# Patient Record
Sex: Female | Born: 1960 | Race: White | Hispanic: No | Smoking: Former smoker
Health system: Southern US, Community
[De-identification: ages and names within clinical notes are randomized; demographics above are authoritative.]

## PROBLEM LIST (undated history)

## (undated) ENCOUNTER — Inpatient Hospital Stay: Payer: Self-pay | Admitting: Internal Medicine

## (undated) DIAGNOSIS — I10 Essential (primary) hypertension: Principal | ICD-10-CM

## (undated) DIAGNOSIS — E282 Polycystic ovarian syndrome: Secondary | ICD-10-CM

## (undated) DIAGNOSIS — E119 Type 2 diabetes mellitus without complications: Secondary | ICD-10-CM

## (undated) DIAGNOSIS — L719 Rosacea, unspecified: Secondary | ICD-10-CM

## (undated) DIAGNOSIS — E785 Hyperlipidemia, unspecified: Secondary | ICD-10-CM

## (undated) DIAGNOSIS — D219 Benign neoplasm of connective and other soft tissue, unspecified: Secondary | ICD-10-CM

## (undated) DIAGNOSIS — E669 Obesity, unspecified: Secondary | ICD-10-CM

## (undated) DIAGNOSIS — G4700 Insomnia, unspecified: Secondary | ICD-10-CM

## (undated) DIAGNOSIS — J309 Allergic rhinitis, unspecified: Secondary | ICD-10-CM

## (undated) HISTORY — PX: REFRACTIVE SURGERY: SHX103

## (undated) HISTORY — DX: Polycystic ovarian syndrome: E28.2

## (undated) HISTORY — PX: PELVIC LAPAROSCOPY: SHX162

## (undated) HISTORY — PX: REDUCTION MAMMAPLASTY: SUR839

## (undated) HISTORY — PX: TUBAL LIGATION: SHX77

## (undated) HISTORY — DX: Allergic rhinitis, unspecified: J30.9

## (undated) HISTORY — DX: Insomnia, unspecified: G47.00

## (undated) HISTORY — DX: Hyperlipidemia, unspecified: E78.5

## (undated) HISTORY — DX: Rosacea, unspecified: L71.9

## (undated) HISTORY — DX: Benign neoplasm of connective and other soft tissue, unspecified: D21.9

## (undated) HISTORY — PX: OTHER SURGICAL HISTORY: SHX169

## (undated) HISTORY — PX: ABDOMINAL HYSTERECTOMY: SHX81

## (undated) HISTORY — DX: Type 2 diabetes mellitus without complications: E11.9

## (undated) HISTORY — DX: Essential (primary) hypertension: I10

## (undated) HISTORY — DX: Obesity, unspecified: E66.9

## (undated) HISTORY — PX: CHOLECYSTECTOMY: SHX55

---

## 1999-05-11 ENCOUNTER — Other Ambulatory Visit: Admission: RE | Admit: 1999-05-11 | Discharge: 1999-05-11 | Payer: Self-pay | Admitting: Obstetrics and Gynecology

## 2000-11-12 ENCOUNTER — Other Ambulatory Visit: Admission: RE | Admit: 2000-11-12 | Discharge: 2000-11-12 | Payer: Self-pay | Admitting: Obstetrics and Gynecology

## 2001-08-08 ENCOUNTER — Encounter: Admission: RE | Admit: 2001-08-08 | Discharge: 2001-11-06 | Payer: Self-pay | Admitting: Gynecology

## 2001-11-13 ENCOUNTER — Inpatient Hospital Stay (HOSPITAL_COMMUNITY): Admission: AD | Admit: 2001-11-13 | Discharge: 2001-11-16 | Payer: Self-pay | Admitting: Gynecology

## 2001-11-13 ENCOUNTER — Encounter (INDEPENDENT_AMBULATORY_CARE_PROVIDER_SITE_OTHER): Payer: Self-pay | Admitting: Specialist

## 2001-12-22 ENCOUNTER — Other Ambulatory Visit: Admission: RE | Admit: 2001-12-22 | Discharge: 2001-12-22 | Payer: Self-pay | Admitting: Gynecology

## 2003-01-29 ENCOUNTER — Other Ambulatory Visit: Admission: RE | Admit: 2003-01-29 | Discharge: 2003-01-29 | Payer: Self-pay | Admitting: Obstetrics and Gynecology

## 2003-11-02 ENCOUNTER — Encounter: Admission: RE | Admit: 2003-11-02 | Discharge: 2003-11-02 | Payer: Self-pay | Admitting: Family Medicine

## 2004-02-18 ENCOUNTER — Other Ambulatory Visit: Admission: RE | Admit: 2004-02-18 | Discharge: 2004-02-18 | Payer: Self-pay | Admitting: Obstetrics and Gynecology

## 2004-03-23 ENCOUNTER — Ambulatory Visit: Payer: Self-pay | Admitting: Internal Medicine

## 2004-04-20 ENCOUNTER — Ambulatory Visit: Payer: Self-pay | Admitting: Family Medicine

## 2004-08-21 ENCOUNTER — Ambulatory Visit: Payer: Self-pay | Admitting: Family Medicine

## 2004-11-24 ENCOUNTER — Encounter: Admission: RE | Admit: 2004-11-24 | Discharge: 2004-11-24 | Payer: Self-pay | Admitting: Obstetrics and Gynecology

## 2005-01-17 ENCOUNTER — Encounter: Admission: RE | Admit: 2005-01-17 | Discharge: 2005-01-17 | Payer: Self-pay | Admitting: Family Medicine

## 2005-01-17 ENCOUNTER — Ambulatory Visit: Payer: Self-pay | Admitting: Family Medicine

## 2005-02-09 ENCOUNTER — Encounter (INDEPENDENT_AMBULATORY_CARE_PROVIDER_SITE_OTHER): Payer: Self-pay | Admitting: Specialist

## 2005-02-09 ENCOUNTER — Inpatient Hospital Stay (HOSPITAL_COMMUNITY): Admission: RE | Admit: 2005-02-09 | Discharge: 2005-02-12 | Payer: Self-pay | Admitting: Obstetrics and Gynecology

## 2005-02-15 ENCOUNTER — Ambulatory Visit (HOSPITAL_COMMUNITY): Admission: RE | Admit: 2005-02-15 | Discharge: 2005-02-15 | Payer: Self-pay | Admitting: Obstetrics and Gynecology

## 2005-03-08 ENCOUNTER — Other Ambulatory Visit: Admission: RE | Admit: 2005-03-08 | Discharge: 2005-03-08 | Payer: Self-pay | Admitting: Obstetrics and Gynecology

## 2005-03-13 ENCOUNTER — Encounter: Admission: RE | Admit: 2005-03-13 | Discharge: 2005-03-13 | Payer: Self-pay | Admitting: Orthopaedic Surgery

## 2005-12-05 ENCOUNTER — Encounter: Admission: RE | Admit: 2005-12-05 | Discharge: 2005-12-05 | Payer: Self-pay | Admitting: Family Medicine

## 2006-03-12 ENCOUNTER — Other Ambulatory Visit: Admission: RE | Admit: 2006-03-12 | Discharge: 2006-03-12 | Payer: Self-pay | Admitting: Obstetrics and Gynecology

## 2007-01-26 DIAGNOSIS — I10 Essential (primary) hypertension: Secondary | ICD-10-CM

## 2007-01-26 HISTORY — DX: Essential (primary) hypertension: I10

## 2007-02-03 ENCOUNTER — Ambulatory Visit: Payer: Self-pay | Admitting: Family Medicine

## 2007-02-13 ENCOUNTER — Ambulatory Visit: Payer: Self-pay | Admitting: Family Medicine

## 2007-03-18 ENCOUNTER — Other Ambulatory Visit: Admission: RE | Admit: 2007-03-18 | Discharge: 2007-03-18 | Payer: Self-pay | Admitting: Obstetrics and Gynecology

## 2007-07-01 DIAGNOSIS — J029 Acute pharyngitis, unspecified: Secondary | ICD-10-CM | POA: Insufficient documentation

## 2007-07-08 ENCOUNTER — Ambulatory Visit: Payer: Self-pay | Admitting: Family Medicine

## 2007-07-08 LAB — CONVERTED CEMR LAB: Rapid Strep: NEGATIVE

## 2007-09-08 ENCOUNTER — Ambulatory Visit: Payer: Self-pay | Admitting: Family Medicine

## 2007-09-08 LAB — CONVERTED CEMR LAB
ALT: 62 units/L — ABNORMAL HIGH (ref 0–35)
AST: 40 units/L — ABNORMAL HIGH (ref 0–37)
Albumin: 4 g/dL (ref 3.5–5.2)
Alkaline Phosphatase: 42 units/L (ref 39–117)
BUN: 9 mg/dL (ref 6–23)
Basophils Absolute: 0 10*3/uL (ref 0.0–0.1)
Basophils Relative: 0.5 % (ref 0.0–1.0)
Bilirubin, Direct: 0.1 mg/dL (ref 0.0–0.3)
CO2: 28 meq/L (ref 19–32)
Calcium: 9.4 mg/dL (ref 8.4–10.5)
Chloride: 99 meq/L (ref 96–112)
Cholesterol: 140 mg/dL (ref 0–200)
Creatinine, Ser: 0.9 mg/dL (ref 0.4–1.2)
Eosinophils Absolute: 0.2 10*3/uL (ref 0.0–0.7)
Eosinophils Relative: 3 % (ref 0.0–5.0)
GFR calc Af Amer: 87 mL/min
GFR calc non Af Amer: 72 mL/min
Glucose, Bld: 99 mg/dL (ref 70–99)
Glucose, Urine, Semiquant: NEGATIVE
HCT: 44.9 % (ref 36.0–46.0)
HDL: 41.5 mg/dL (ref >39.0)
Hemoglobin: 15.7 g/dL — ABNORMAL HIGH (ref 12.0–15.0)
Hgb A1c MFr Bld: 5.8 % (ref 4.6–6.0)
Ketones, urine, test strip: NEGATIVE
LDL Cholesterol: 81 mg/dL (ref 0–99)
Lymphocytes Relative: 24.7 % (ref 12.0–46.0)
MCHC: 35 g/dL (ref 30.0–36.0)
MCV: 96.6 fL (ref 78.0–100.0)
Monocytes Absolute: 0.6 10*3/uL (ref 0.1–1.0)
Monocytes Relative: 7.5 % (ref 3.0–12.0)
Neutro Abs: 4.9 10*3/uL (ref 1.4–7.7)
Neutrophils Relative %: 64.3 % (ref 43.0–77.0)
Nitrite: NEGATIVE
Platelets: 182 10*3/uL (ref 150–400)
Potassium: 3.2 meq/L — ABNORMAL LOW (ref 3.5–5.1)
RBC: 4.65 M/uL (ref 3.87–5.11)
RDW: 12.5 % (ref 11.5–14.6)
Sodium: 139 meq/L (ref 135–145)
Specific Gravity, Urine: >=1.03
TSH: 1.49 microintl units/mL (ref 0.35–5.50)
Testosterone: 69.13 ng/dL (ref 10.00–70.0)
Total Bilirubin: 1.3 mg/dL — ABNORMAL HIGH (ref 0.3–1.2)
Total CHOL/HDL Ratio: 3.4
Total Protein: 6.8 g/dL (ref 6.0–8.3)
Triglycerides: 87 mg/dL (ref 0–149)
Urobilinogen, UA: 0.2
VLDL: 17 mg/dL (ref 0–40)
WBC Urine, dipstick: NEGATIVE
WBC: 7.6 10*3/uL (ref 4.5–10.5)
pH: 5.5

## 2007-09-15 ENCOUNTER — Ambulatory Visit: Payer: Self-pay | Admitting: Family Medicine

## 2007-09-15 DIAGNOSIS — E282 Polycystic ovarian syndrome: Secondary | ICD-10-CM | POA: Insufficient documentation

## 2007-09-15 DIAGNOSIS — J309 Allergic rhinitis, unspecified: Secondary | ICD-10-CM | POA: Insufficient documentation

## 2007-09-15 DIAGNOSIS — E785 Hyperlipidemia, unspecified: Secondary | ICD-10-CM

## 2007-09-15 DIAGNOSIS — R3129 Other microscopic hematuria: Secondary | ICD-10-CM | POA: Insufficient documentation

## 2007-09-15 HISTORY — DX: Allergic rhinitis, unspecified: J30.9

## 2007-09-15 HISTORY — DX: Hyperlipidemia, unspecified: E78.5

## 2007-09-15 HISTORY — DX: Polycystic ovarian syndrome: E28.2

## 2007-09-15 LAB — CONVERTED CEMR LAB
Bilirubin Urine: NEGATIVE
Glucose, Urine, Semiquant: NEGATIVE
Ketones, urine, test strip: NEGATIVE
Nitrite: NEGATIVE
Protein, U semiquant: 30
Specific Gravity, Urine: 1.025
Urobilinogen, UA: 0.2
WBC Urine, dipstick: NEGATIVE
pH: 5

## 2007-09-29 ENCOUNTER — Telehealth: Payer: Self-pay | Admitting: Family Medicine

## 2007-10-07 ENCOUNTER — Ambulatory Visit: Payer: Self-pay | Admitting: Family Medicine

## 2007-10-07 DIAGNOSIS — N390 Urinary tract infection, site not specified: Secondary | ICD-10-CM | POA: Insufficient documentation

## 2007-10-07 LAB — CONVERTED CEMR LAB
Bilirubin Urine: NEGATIVE
Glucose, Urine, Semiquant: NEGATIVE
Ketones, urine, test strip: NEGATIVE
Nitrite: NEGATIVE
Specific Gravity, Urine: 1.01
Urobilinogen, UA: 0.2
WBC Urine, dipstick: NEGATIVE
pH: 5

## 2007-10-16 ENCOUNTER — Ambulatory Visit: Payer: Self-pay | Admitting: Family Medicine

## 2007-10-16 DIAGNOSIS — J45909 Unspecified asthma, uncomplicated: Secondary | ICD-10-CM | POA: Insufficient documentation

## 2008-03-18 ENCOUNTER — Ambulatory Visit: Payer: Self-pay | Admitting: Obstetrics and Gynecology

## 2008-03-18 ENCOUNTER — Other Ambulatory Visit: Admission: RE | Admit: 2008-03-18 | Discharge: 2008-03-18 | Payer: Self-pay | Admitting: Obstetrics and Gynecology

## 2008-03-18 ENCOUNTER — Encounter: Payer: Self-pay | Admitting: Obstetrics and Gynecology

## 2008-04-28 ENCOUNTER — Encounter: Admission: RE | Admit: 2008-04-28 | Discharge: 2008-04-28 | Payer: Self-pay | Admitting: Obstetrics and Gynecology

## 2008-05-25 ENCOUNTER — Telehealth: Payer: Self-pay | Admitting: Family Medicine

## 2008-09-06 ENCOUNTER — Telehealth: Payer: Self-pay | Admitting: Family Medicine

## 2008-09-08 ENCOUNTER — Telehealth: Payer: Self-pay | Admitting: *Deleted

## 2008-11-08 ENCOUNTER — Ambulatory Visit: Payer: Self-pay | Admitting: Family Medicine

## 2008-11-08 LAB — CONVERTED CEMR LAB
ALT: 33 units/L (ref 0–35)
AST: 23 units/L (ref 0–37)
Albumin: 4.3 g/dL (ref 3.5–5.2)
Alkaline Phosphatase: 44 units/L (ref 39–117)
BUN: 15 mg/dL (ref 6–23)
Basophils Absolute: 0 10*3/uL (ref 0.0–0.1)
Basophils Relative: 0.2 % (ref 0.0–3.0)
Bilirubin, Direct: 0.2 mg/dL (ref 0.0–0.3)
CO2: 29 meq/L (ref 19–32)
Calcium: 9.5 mg/dL (ref 8.4–10.5)
Chloride: 104 meq/L (ref 96–112)
Cholesterol: 178 mg/dL (ref 0–200)
Creatinine, Ser: 0.8 mg/dL (ref 0.4–1.2)
Creatinine,U: 270.5 mg/dL
Direct LDL: 108.6 mg/dL
Eosinophils Absolute: 0.1 10*3/uL (ref 0.0–0.7)
Eosinophils Relative: 1.2 % (ref 0.0–5.0)
GFR calc non Af Amer: 82.2 mL/min (ref >60)
Glucose, Bld: 91 mg/dL (ref 70–99)
Glucose, Urine, Semiquant: NEGATIVE
HCT: 45.9 % (ref 36.0–46.0)
HDL: 39.9 mg/dL (ref >39.00)
Hemoglobin: 16.2 g/dL — ABNORMAL HIGH (ref 12.0–15.0)
Hgb A1c MFr Bld: 5.9 % (ref 4.6–6.5)
Lymphocytes Relative: 18.2 % (ref 12.0–46.0)
Lymphs Abs: 2.1 10*3/uL (ref 0.7–4.0)
MCHC: 35.3 g/dL (ref 30.0–36.0)
MCV: 97.3 fL (ref 78.0–100.0)
Microalb Creat Ratio: 20 mg/g (ref 0.0–30.0)
Microalb, Ur: 5.4 mg/dL — ABNORMAL HIGH (ref 0.0–1.9)
Monocytes Absolute: 0.7 10*3/uL (ref 0.1–1.0)
Monocytes Relative: 6.2 % (ref 3.0–12.0)
Neutro Abs: 8.4 10*3/uL — ABNORMAL HIGH (ref 1.4–7.7)
Neutrophils Relative %: 74.2 % (ref 43.0–77.0)
Nitrite: NEGATIVE
Platelets: 165 10*3/uL (ref 150.0–400.0)
Potassium: 3.8 meq/L (ref 3.5–5.1)
RBC: 4.72 M/uL (ref 3.87–5.11)
RDW: 12.2 % (ref 11.5–14.6)
Sodium: 144 meq/L (ref 135–145)
Specific Gravity, Urine: >=1.03
TSH: 2.07 microintl units/mL (ref 0.35–5.50)
Testosterone: 53.17 ng/dL (ref 10.00–70.00)
Total Bilirubin: 1.6 mg/dL — ABNORMAL HIGH (ref 0.3–1.2)
Total CHOL/HDL Ratio: 4
Total Protein: 7.4 g/dL (ref 6.0–8.3)
Triglycerides: 206 mg/dL — ABNORMAL HIGH (ref 0.0–149.0)
Urobilinogen, UA: 0.2
VLDL: 41.2 mg/dL — ABNORMAL HIGH (ref 0.0–40.0)
WBC Urine, dipstick: NEGATIVE
WBC: 11.3 10*3/uL — ABNORMAL HIGH (ref 4.5–10.5)
pH: 5

## 2008-11-25 ENCOUNTER — Ambulatory Visit: Payer: Self-pay | Admitting: Family Medicine

## 2009-01-07 ENCOUNTER — Ambulatory Visit: Payer: Self-pay | Admitting: Internal Medicine

## 2009-01-21 ENCOUNTER — Ambulatory Visit: Payer: Self-pay | Admitting: Internal Medicine

## 2009-01-21 HISTORY — PX: COLONOSCOPY: SHX5424

## 2009-01-28 ENCOUNTER — Telehealth: Payer: Self-pay | Admitting: Family Medicine

## 2009-02-01 ENCOUNTER — Telehealth: Payer: Self-pay | Admitting: *Deleted

## 2009-09-12 ENCOUNTER — Ambulatory Visit: Payer: Self-pay | Admitting: Family Medicine

## 2009-09-13 ENCOUNTER — Encounter: Admission: RE | Admit: 2009-09-13 | Discharge: 2009-09-13 | Payer: Self-pay | Admitting: Obstetrics and Gynecology

## 2009-09-16 ENCOUNTER — Encounter: Payer: Self-pay | Admitting: Family Medicine

## 2009-10-11 ENCOUNTER — Ambulatory Visit: Payer: Self-pay | Admitting: Obstetrics and Gynecology

## 2009-10-11 ENCOUNTER — Other Ambulatory Visit: Admission: RE | Admit: 2009-10-11 | Discharge: 2009-10-11 | Payer: Self-pay | Admitting: Obstetrics and Gynecology

## 2009-11-08 ENCOUNTER — Ambulatory Visit: Payer: Self-pay | Admitting: Family Medicine

## 2009-11-08 LAB — CONVERTED CEMR LAB
ALT: 45 units/L — ABNORMAL HIGH (ref 0–35)
AST: 26 units/L (ref 0–37)
Albumin: 4.1 g/dL (ref 3.5–5.2)
Alkaline Phosphatase: 49 units/L (ref 39–117)
BUN: 12 mg/dL (ref 6–23)
Basophils Absolute: 0.1 10*3/uL (ref 0.0–0.1)
Basophils Relative: 0.9 % (ref 0.0–3.0)
Bilirubin Urine: NEGATIVE
Bilirubin, Direct: 0.1 mg/dL (ref 0.0–0.3)
CO2: 26 meq/L (ref 19–32)
Calcium: 9.5 mg/dL (ref 8.4–10.5)
Chloride: 106 meq/L (ref 96–112)
Cholesterol: 210 mg/dL — ABNORMAL HIGH (ref 0–200)
Creatinine, Ser: 0.8 mg/dL (ref 0.4–1.2)
Creatinine,U: 95.8 mg/dL
Direct LDL: 138.8 mg/dL
Eosinophils Absolute: 0.2 10*3/uL (ref 0.0–0.7)
Eosinophils Relative: 2.5 % (ref 0.0–5.0)
GFR calc non Af Amer: 77.36 mL/min (ref >60)
Glucose, Bld: 79 mg/dL (ref 70–99)
Glucose, Urine, Semiquant: NEGATIVE
HCT: 44.3 % (ref 36.0–46.0)
HDL: 42.4 mg/dL (ref >39.00)
Hemoglobin: 15.1 g/dL — ABNORMAL HIGH (ref 12.0–15.0)
Hgb A1c MFr Bld: 5.7 % (ref 4.6–6.5)
Ketones, urine, test strip: NEGATIVE
Lymphocytes Relative: 29.7 % (ref 12.0–46.0)
Lymphs Abs: 2.1 10*3/uL (ref 0.7–4.0)
MCHC: 34.1 g/dL (ref 30.0–36.0)
MCV: 99 fL (ref 78.0–100.0)
Microalb Creat Ratio: 2.4 mg/g (ref 0.0–30.0)
Microalb, Ur: 2.3 mg/dL — ABNORMAL HIGH (ref 0.0–1.9)
Monocytes Absolute: 0.5 10*3/uL (ref 0.1–1.0)
Monocytes Relative: 7.4 % (ref 3.0–12.0)
Neutro Abs: 4.1 10*3/uL (ref 1.4–7.7)
Neutrophils Relative %: 59.5 % (ref 43.0–77.0)
Nitrite: NEGATIVE
Platelets: 193 10*3/uL (ref 150.0–400.0)
Potassium: 4.7 meq/L (ref 3.5–5.1)
Protein, U semiquant: NEGATIVE
RBC: 4.48 M/uL (ref 3.87–5.11)
RDW: 13.4 % (ref 11.5–14.6)
Sodium: 142 meq/L (ref 135–145)
Specific Gravity, Urine: 1.02
TSH: 2.59 microintl units/mL (ref 0.35–5.50)
Testosterone: 49.26 ng/dL (ref 10.00–70.00)
Total Bilirubin: 1.3 mg/dL — ABNORMAL HIGH (ref 0.3–1.2)
Total CHOL/HDL Ratio: 5
Total Protein: 6.5 g/dL (ref 6.0–8.3)
Triglycerides: 269 mg/dL — ABNORMAL HIGH (ref 0.0–149.0)
Urobilinogen, UA: 0.2
VLDL: 53.8 mg/dL — ABNORMAL HIGH (ref 0.0–40.0)
WBC Urine, dipstick: NEGATIVE
WBC: 6.9 10*3/uL (ref 4.5–10.5)
pH: 5

## 2009-11-15 ENCOUNTER — Ambulatory Visit: Payer: Self-pay | Admitting: Family Medicine

## 2010-05-01 ENCOUNTER — Telehealth: Payer: Self-pay | Admitting: Family Medicine

## 2010-05-03 ENCOUNTER — Encounter: Payer: Self-pay | Admitting: Family Medicine

## 2010-05-03 ENCOUNTER — Ambulatory Visit (INDEPENDENT_AMBULATORY_CARE_PROVIDER_SITE_OTHER): Payer: Self-pay | Admitting: Family Medicine

## 2010-05-03 DIAGNOSIS — N63 Unspecified lump in unspecified breast: Secondary | ICD-10-CM

## 2010-05-03 DIAGNOSIS — N6320 Unspecified lump in the left breast, unspecified quadrant: Secondary | ICD-10-CM

## 2010-05-03 DIAGNOSIS — I10 Essential (primary) hypertension: Secondary | ICD-10-CM

## 2010-05-03 NOTE — Patient Instructions (Signed)
Change the Hyzaar, and take it in the morning.  Check her blood pressure daily and fax me the results at [phone] in 3 weeks.  Check the breast lump weekly if it stays the same will leave it alone if you feel like it's increasing in size.  There will need to get she is set up to go back and see the surgeon to have it excised in an hour and a for

## 2010-05-03 NOTE — Assessment & Plan Note (Signed)
Change your time of day to take your medication and take it every day.  In the morning.  Then, check a morning blood pressure daily and fax me in the data in 3 weeks fax number [phone].  I will then call you and we will discuss any further changes in her medication.

## 2010-05-03 NOTE — Assessment & Plan Note (Signed)
I recommend use check the lesion once weekly if you think it's increasing in size.  Call me and we will get to setup for a surgical consult for removal

## 2010-05-03 NOTE — Progress Notes (Signed)
Monica Cunningham is a 50 year old single female, who comes in today for evaluation of two problems.  She Has had  reduction mammaplasty and does a thorough breast exam.  Weekly.  This week.  She noticed a lump in her left breast at the 9 o'clock position in the scar around the areola.  She has noticed no change in the skin.  No nipple discharges..  She states she had a cyst like this before after her reduction mammoplasty .  The surgeon removed, it.  It was a small cyst.  Her blood pressure on Hyzaar hundred -- 25 is running 130 to 140 systolic, 85 to 90 diastolic.  She is compliant with her medication however, she always takes her medication at bedtime.  Advised her to take her medication in the morning.  Her physical exam shows a 3-mm by 3-mm, soft, rubbery, movable lesion at the 9 clock position of her left breast in the scar area from previous surgery.  The rest of the breast exam was normal except for scars noted from previous reduction mammoplasty surgery  BP is 120/90 right arm sitting position

## 2010-05-04 NOTE — Medication Information (Signed)
Summary: Coverage Approval for Singulair  Coverage Approval for Singulair   Imported By: Maryln Gottron 09/22/2009 13:07:30  _____________________________________________________________________  External Attachment:    Type:   Image     Comment:   External Document

## 2010-05-04 NOTE — Assessment & Plan Note (Signed)
Summary: cpx/mm   Vital Signs:  Patient profile:   50 year old female Menstrual status:  hysterectomy Height:      60.75 inches Weight:      161 pounds BMI:     30.78 Temp:     98.4 degrees F oral BP sitting:   140 / 90  (left arm) Cuff size:   regular  Vitals Entered By: Kern Reap CMA Duncan Dull) (November 15, 2009 9:39 AM) CC: cpx Is Patient Diabetic? No   CC:  cpx.  History of Present Illness: Monica Cunningham is a 50 year old divorced female with a daughter and two sons at home, who comes in today for an evaluation because of underlying allergic rhinitis, hypertension, polycystic ovarian syndrome, asymptomatic hematuria, and a history of asthma.  Her asthma and allergic rhinitis is treated with Singulair 10 mg daily, steroid nasal spray nightly.  Her polycystic ovarian syndrome is treated with med Sherron Monday 500 mg long-acting tablets, two tabs in the morning, two tabs in the TM.  Blood sugar normal.  A1c5.7.  No hypoglycemia.  Her hypertension is treated with Cozaar 100 mg daily.  BP 140/90.  Her nightly advised her to take a diuretic that would help suppress hair growth.  I think this is probably triamterene.  She also takes clonazepam 1 mg daily p.r.n. for anxiety.  She gets routine eye care, dental care, BSE monthly, annual mammography, By G[facility], and new evaluation by endocrinologist.  She has a cyst on her right hand.  She would like evaluated.  Tetanus booster 2006  we also decreased to simvastatin to 20 mg tonight's weekly, because she was experiencing muscle plane with daily therapy   Allergies: 1)  ! Codeine  Past History:  Past medical, surgical, family and social histories (including risk factors) reviewed, and no changes noted (except as noted below).  Past Medical History: Reviewed history from 09/15/2007 and no changes required. polycystic ovaries hypertension.  2008 bilateral breast reduction childbirth x 2 (one set twins) allergic rhinitis sleep dysfunction cough  secondary to ACE inhibitor hysterectomy  Past Surgical History: reduction mammoplasty  Family History: Reviewed history from 02/13/2007 and no changes required. Family History Hypertension  Social History: Reviewed history from 02/13/2007 and no changes required. Divorced Never Smoked Alcohol use-no Drug use-no Regular exercise-no  Review of Systems      See HPI  Physical Exam  General:  Well-developed,well-nourished,in no acute distress; alert,appropriate and cooperative throughout examination Head:  Normocephalic and atraumatic without obvious abnormalities. No apparent alopecia or balding. Eyes:  No corneal or conjunctival inflammation noted. EOMI. Perrla. Funduscopic exam benign, without hemorrhages, exudates or papilledema. Vision grossly normal. Ears:  External ear exam shows no significant lesions or deformities.  Otoscopic examination reveals clear canals, tympanic membranes are intact bilaterally without bulging, retraction, inflammation or discharge. Hearing is grossly normal bilaterally. Nose:  External nasal examination shows no deformity or inflammation. Nasal mucosa are pink and moist without lesions or exudates. Mouth:  Oral mucosa and oropharynx without lesions or exudates.  Teeth in good repair. Neck:  No deformities, masses, or tenderness noted. Chest Wall:  No deformities, masses, or tenderness noted. Breasts:  scars from reduction mammoplasty, otherwise, no palpable masses Lungs:  Normal respiratory effort, chest expands symmetrically. Lungs are clear to auscultation, no crackles or wheezes. Heart:  Normal rate and regular rhythm. S1 and S2 normal without gallop, murmur, click, rub or other extra sounds. Abdomen:  Bowel sounds positive,abdomen soft and non-tender without masses, organomegaly or hernias noted. Msk:  No  deformity or scoliosis noted of thoracic or lumbar spine.   Pulses:  R and L carotid,radial,femoral,dorsalis pedis and posterior tibial pulses  are full and equal bilaterally Extremities:  No clubbing, cyanosis, edema, or deformity noted with normal full range of motion of all joints.   Neurologic:  No cranial nerve deficits noted. Station and gait are normal. Plantar reflexes are down-going bilaterally. DTRs are symmetrical throughout. Sensory, motor and coordinative functions appear intact. Skin:  Intact without suspicious lesions or rashes Cervical Nodes:  No lymphadenopathy noted Axillary Nodes:  No palpable lymphadenopathy Inguinal Nodes:  No significant adenopathy Psych:  Cognition and judgment appear intact. Alert and cooperative with normal attention span and concentration. No apparent delusions, illusions, hallucinations   Impression & Recommendations:  Problem # 1:  EXTRINSIC ASTHMA, UNSPECIFIED (ICD-493.00) Assessment Improved  Her updated medication list for this problem includes:    Singulair 10 Mg Tabs (Montelukast sodium) .Marland Kitchen... As needed  Orders: Prescription Created Electronically [phone]) EKG w/ Interpretation (93000)  Problem # 2:  HEMATURIA, HX OF (ICD-V13.09) Assessment: Unchanged  Orders: Prescription Created Electronically [phone])  Problem # 3:  POLYCYSTIC OVARIAN DISEASE (ICD-256.4) Assessment: Improved  Problem # 4:  HYPERLIPIDEMIA (ICD-272.4) Assessment: Improved  Orders: Prescription Created Electronically [phone]) EKG w/ Interpretation (93000)  Her updated medication list for this problem includes:    Zocor 20 Mg Tabs (Simvastatin) .Marland Kitchen... 1 tab @ bedtime  Problem # 5:  ESSENTIAL HYPERTENSION, BENIGN (ICD-401.1) Assessment: Improved  The following medications were removed from the medication list:    Cozaar 100 Mg Tabs (Losartan potassium) .Marland Kitchen... Take one tab by mouth once daily Her updated medication list for this problem includes:    Cozaar 50 Mg Tabs (Losartan potassium) .Marland Kitchen... Take 1 & 1/2 qam    Hyzaar 100-25 Mg Tabs (Losartan potassium-hctz) .Marland Kitchen... Take 1 tablet by mouth every  morning  Orders: Prescription Created Electronically [phone])  Problem # 6:  HYPERLIPIDEMIA (ICD-272.4) Assessment: Improved  Orders: Prescription Created Electronically [phone]) EKG w/ Interpretation (93000)  Her updated medication list for this problem includes:    Zocor 20 Mg Tabs (Simvastatin) .Marland Kitchen... 1 tab @ bedtime  Complete Medication List: 1)  Aspirin 81 Mg  .... Otc 2)  Finacea 15 % Gel (Azelaic acid) .... As needed 3)  Plexion Cleanser 10-5 % Emul (Sulfacetamide sodium-sulfur) .... Apply two times a day 4)  Singulair 10 Mg Tabs (Montelukast sodium) .... As needed 5)  Nasonex 50 Mcg/act Susp (Mometasone furoate) .... As needed 6)  Clonazepam 1 Mg Tabs (Clonazepam) .... As needed 7)  Cozaar 50 Mg Tabs (Losartan potassium) .... Take 1 & 1/2 qam 8)  Miralax Powd (Polyethylene glycol 3350) .... As directed 9)  Metformin Hcl 500 Mg Xr24h-tab (Metformin hcl) .... Take 2 by mouth in the am and 2 tab by mouth in the pm 10)  Hyzaar 100-25 Mg Tabs (Losartan potassium-hctz) .... Take 1 tablet by mouth every morning 11)  Zocor 20 Mg Tabs (Simvastatin) .Marland Kitchen.. 1 tab @ bedtime  Anticoagulation Management Assessment/Plan:            Patient Instructions: 1)  we will add hydrochlorothiazide 25 mg daily, Cozaar.  Take one tablet daily.  If after 4 weeks y  blood pressure is not at goal.  Return for reevaluation 2)  Please schedule a follow-up appointment in 1 year. 3)  Please schedule a follow-up appointment as needed. 4)  It is important that you exercise regularly at least 20 minutes 5 times a week. If you develop  chest pain, have severe difficulty breathing, or feel very tired , stop exercising immediately and seek medical attention. 5)  Schedule your mammogram. 6)  Take an Aspirin every day. 7)  simvastatin 20 mg twice weekly Prescriptions: ZOCOR 20 MG TABS (SIMVASTATIN) 1 tab @ bedtime  #100 x 2   Entered and Authorized by:   Roderick Pee MD   Signed by:   Roderick Pee MD on  11/15/2009   Method used:   Electronically to        [facility]  [phone]* (retail)       [address]       [place], [place]  [address]       Ph: [phone] or [phone]       Fax: [phone]   RxID:   [id number] METFORMIN HCL 500 MG XR24H-TAB (METFORMIN HCL) take 2 by mouth in the am and 2 tab by mouth in the pm  #400 x 3   Entered and Authorized by:   Roderick Pee MD   Signed by:   Roderick Pee MD on 11/15/2009   Method used:   Electronically to        [facility]  [phone]* (retail)       [address]       [place], [place]  [address]       Ph: [phone] or [phone]       Fax: [phone]   RxID:   [id number] SINGULAIR 10 MG  TABS (MONTELUKAST SODIUM) as needed  #100 x 3   Entered and Authorized by:   Roderick Pee MD   Signed by:   Roderick Pee MD on 11/15/2009   Method used:   Electronically to        [facility]  [phone]* (retail)       [address]       [place], [place]  [address]       Ph: [phone] or [phone]       Fax: [phone]   RxID:   [id number] HYZAAR 100-25 MG TABS (LOSARTAN POTASSIUM-HCTZ) Take 1 tablet by mouth every morning  #100 x 3   Entered and Authorized by:   Roderick Pee MD   Signed by:   Roderick Pee MD on 11/15/2009   Method used:   Electronically to        [facility]  [phone]* (retail)       [address]       [place], [place]  [address]       Ph: [phone] or [phone]       Fax: [phone]   RxID:   [id number]

## 2010-05-04 NOTE — Assessment & Plan Note (Signed)
Summary: fup on meds//ccm   Vital Signs:  Patient profile:   50 year old female Menstrual status:  hysterectomy Weight:      157 pounds Temp:     98.7 degrees F oral BP sitting:   140 / 90  (right arm)  Vitals Entered By: Kathrynn Speed CMA (September 12, 2009 10:02 AM) CC: FU BP meds not working   CC:  FU BP meds not working.  History of Present Illness: yamil is a 50 year old single female, who comes in today because her blood pressure is not under good control on Cozaar 50 mg daily.  BP averaging 140/90.  She's been compliant with her medication.  Blood sugar is normal on metformin 1000 mg b.i.d. needs.  A1c will do that today.  She stopped her simvastatin 3 months ago because of generalized aching.  She states the aching went away.  Advised to restart the simvastatin to just take one tablet on Monday and Friday  she's also complaining of, thoracic back pain.  It's been present for a couple months.  During tax season.  She worked about 60 to 70 hours per week    Current Medications (verified): 1)  Aspirin 81 Mg .... Otc 2)  Finacea 15 %  Gel (Azelaic Acid) .... As Needed 3)  Plexion Cleanser 10-5 %  Emul (Sulfacetamide Sodium-Sulfur) .... Apply Two Times A Day 4)  Singulair 10 Mg  Tabs (Montelukast Sodium) .... As Needed 5)  Nasonex 50 Mcg/act  Susp (Mometasone Furoate) .... As Needed 6)  Clonazepam 1 Mg  Tabs (Clonazepam) .... As Needed 7)  Cozaar 50 Mg Tabs (Losartan Potassium) .... Take 1 Tablet By Mouth Every Morning 8)  Metformin Hcl 1000 Mg Tabs (Metformin Hcl) .... Take 1 Tablet By Mouth Two Times A Day 9)  Miralax   Powd (Polyethylene Glycol 3350) .... As Directed  Allergies (verified): 1)  ! Codeine  Past History:  Past medical, surgical, family and social histories (including risk factors) reviewed for relevance to current acute and chronic problems.  Past Medical History: Reviewed history from 09/15/2007 and no changes required. polycystic  ovaries hypertension.  2008 bilateral breast reduction childbirth x 2 (one set twins) allergic rhinitis sleep dysfunction cough secondary to ACE inhibitor hysterectomy  Family History: Reviewed history from 02/13/2007 and no changes required. Family History Hypertension  Social History: Reviewed history from 02/13/2007 and no changes required. Divorced Never Smoked Alcohol use-no Drug use-no Regular exercise-no  Review of Systems      See HPI  Physical Exam  General:  Well-developed,well-nourished,in no acute distress; alert,appropriate and cooperative throughout examination Msk:  No deformity or scoliosis noted of thoracic or lumbar spine.   Skin:  Intact without suspicious lesions or rashes   Impression & Recommendations:  Problem # 1:  HYPERLIPIDEMIA (ICD-272.4) Assessment Deteriorated  The following medications were removed from the medication list:    Simvastatin 20 Mg Tabs (Simvastatin) .Marland Kitchen... 1 tab @ bedtime  Problem # 2:  ESSENTIAL HYPERTENSION, BENIGN (ICD-401.1) Assessment: Deteriorated  Her updated medication list for this problem includes:    Cozaar 50 Mg Tabs (Losartan potassium) .Marland Kitchen... Take 1 & 1/2 qam  Complete Medication List: 1)  Aspirin 81 Mg  .... Otc 2)  Finacea 15 % Gel (Azelaic acid) .... As needed 3)  Plexion Cleanser 10-5 % Emul (Sulfacetamide sodium-sulfur) .... Apply two times a day 4)  Singulair 10 Mg Tabs (Montelukast sodium) .... As needed 5)  Nasonex 50 Mcg/act Susp (Mometasone furoate) .... As needed  6)  Clonazepam 1 Mg Tabs (Clonazepam) .... As needed 7)  Cozaar 50 Mg Tabs (Losartan potassium) .... Take 1 & 1/2 qam 8)  Metformin Hcl 1000 Mg Tabs (Metformin hcl) .... Take 1 tablet by mouth two times a day 9)  Miralax Powd (Polyethylene glycol 3350) .... As directed  Patient Instructions: 1)  increase the Cozaar to 75 mg daily check her BP daily in the morning.  If after 4 weeks, your blood pressure is not normal........Marland Kitchen 135/85 or  less........... then increase the Cozaar to 100 mg daily. 2)  Take 600 mg of Motrin twice a day with food and to 15 minutes of stretching for your back pain. 3)  Return the fourth week in August for your annual exam. 4)  BMP prior to visit, ICD-9:..............256.4,272.4 5)  Hepatic Panel prior to visit, ICD-9: 6)  Lipid Panel prior to visit, ICD-9: 7)  TSH prior to visit, ICD-9: 8)  CBC w/ Diff prior to visit, ICD-9: 9)  Urine-dip prior to visit, ICD-9: 10)  HbgA1C prior to visit, ICD-9: 11)  Urine Microalbumin prior to visit, ICD-9: Prescriptions: CLONAZEPAM 1 MG  TABS (CLONAZEPAM) as needed  #100 x 4   Entered and Authorized by:   Roderick Pee MD   Signed by:   Roderick Pee MD on 09/12/2009   Method used:   Print then Give to Patient   RxID:   [id number] SINGULAIR 10 MG  TABS (MONTELUKAST SODIUM) as needed  #100 x 3   Entered and Authorized by:   Roderick Pee MD   Signed by:   Roderick Pee MD on 09/12/2009   Method used:   Print then Give to Patient   RxID:   [id number] COZAAR 50 MG TABS (LOSARTAN POTASSIUM) Take 1 & 1/2 qam  #150 x 3   Entered and Authorized by:   Roderick Pee MD   Signed by:   Roderick Pee MD on 09/12/2009   Method used:   Print then Give to Patient   RxID:   [id number]

## 2010-05-10 NOTE — Progress Notes (Signed)
Summary: Pt req referral to [facility]  Phone Note Call from Patient Call back at Home Phone [phone] Call back at [phone] cell   Caller: Patient Summary of Call: Pt req referral to [facility] for mmg. Pt found small lump in lft breast.  Initial call taken by: Lucy Antigua,  May 01, 2010 10:38 AM  Follow-up for Phone Call        office visit today or tomorrow and then we will set up for diagnostic studies Follow-up by: Roderick Pee MD,  May 01, 2010 11:05 AM  Additional Follow-up for Phone Call Additional follow up Details #1::        patient is aware and appointment made Additional Follow-up by: Kern Reap CMA Duncan Dull),  May 01, 2010 3:48 PM

## 2010-07-05 LAB — HM DIABETES EYE EXAM

## 2010-07-06 LAB — GLUCOSE, CAPILLARY
Glucose-Capillary: 108 mg/dL — ABNORMAL HIGH (ref 70–99)
Glucose-Capillary: 94 mg/dL (ref 70–99)

## 2010-07-12 ENCOUNTER — Encounter: Payer: Self-pay | Admitting: Family Medicine

## 2010-07-14 ENCOUNTER — Telehealth: Payer: Self-pay | Admitting: Family Medicine

## 2010-07-14 NOTE — Telephone Encounter (Signed)
Patient would like for Fleet Contras to return her call today.

## 2010-07-14 NOTE — Telephone Encounter (Signed)
patient  Is calling because she has been taking one and half tabs of hyzaar 100/25 and has been having normal blood pressure readings.  She would like to know if this is okay and if so she will need a refill.

## 2010-07-17 ENCOUNTER — Other Ambulatory Visit: Payer: Self-pay | Admitting: *Deleted

## 2010-07-17 MED ORDER — CLONAZEPAM 1 MG PO TABS: 1.0000 mg | ORAL_TABLET | Freq: Two times a day (BID) | ORAL | Status: DC | PRN

## 2010-07-17 MED ORDER — LOSARTAN POTASSIUM-HCTZ 100-25 MG PO TABS: 1.0000 | ORAL_TABLET | Freq: Two times a day (BID) | ORAL | Status: DC

## 2010-07-17 NOTE — Telephone Encounter (Signed)
If blood pressure is indeed normal, then, she needs to continue that dose of medication

## 2010-08-18 NOTE — Op Note (Signed)
Monica Cunningham, PERSINGER                 ACCOUNT NO.:  [id number]   MEDICAL RECORD NO.:  [id number]          PATIENT TYPE:  INP   LOCATION:  X001                         FACILITY:  [facility]   PHYSICIAN:  Rande Brunt. Gottsegen, M.D.DATE OF BIRTH:  1960/04/25   DATE OF PROCEDURE:  02/09/2005  DATE OF DISCHARGE:                                 OPERATIVE REPORT   PREOPERATIVE DIAGNOSES:  1.  Dysfunctional uterine bleeding with anemia.  2.  Dysmenorrhea.  3.  Adenomyosis.  4.  Urinary stress incontinence.   POSTOPERATIVE DIAGNOSES:  1.  Dysfunctional uterine bleeding with anemia.  2.  Dysmenorrhea.  3.  Adenomyosis.  4.  Urinary stress incontinence.   OPERATIONS:  1.  Total abdominal hysterectomy.  2.  Modified McCall's closure of cul-de-sac.  3.  Burch procedure.  4.  Closure of small cystotomy incision.   SURGEON:  Dr. Eda Paschal   FIRST ASSISTANT:  Dr. Farrel Gobble   FINDINGS:  The patient had a boggy, slightly enlarged uterus.  She had some  clear, raised lesions on both ovaries that raised the suspicion that they  were superficial endometrial implants.  They were not pigmented or white.  She had 1 black pigmentation of endometriosis in the cul-de-sac.  Ovaries  and fallopian tubes were otherwise normal as was rest of the exploration of  the abdomen.  At the time of Burch, a small cystotomy incision was made at  the top of the fundus during the suturing of less than 0.5 cm.   PROCEDURE:  After adequate general anesthesia, the patient was placed in the  modified supine position, so the surgeon later could get his fingers into  the vagina to do the Burch, and she was then prepped and draped the usual  sterile manner.  A small transverse incision was made using the area where  she had her abdominoplasty.  It was taken down, and the fascia was opened  transversely, and the peritoneum was entered vertically.  Subcutaneous  bleeders were clamped and bovied as encountered.  When the peritoneal  cavity  was opened, the above findings were noted.  The round ligaments were bovied  and cut.  The vesicouterine fold of peritoneum and posterior peritoneum were  sharply incised.  The utero-ovarian ligaments and fallopian tubes were  clamped, cut, and doubly suture ligated with #1 chromic catgut, leaving the  ovaries and tubes in place.  The areas that could have been clear areas of  endometriosis were cauterized on both ovaries.  The bladder flap was  advanced by sharp dissection.  The uterine arteries were clamped, cut, and  doubly suture ligated with #1 chromic catgut.  The parametrium was taken  down successive bites by clamping, cutting, and suture ligating with #1  chromic catgut.  The uterosacral cardinals were isolated and sutured in  order to help with the McCall's with #1 chromic catgut.  The cervical  vaginal junction was identified and entered, and the uterus was sent to  pathology for tissue diagnosis.  Angled sutures were placed in the angles of  the vagina with #1 chromic catgut.  Number 1 chromic was used in figure-of-  eights to close the cuff.  Copious irrigation was done, and hemostasis was  complete.  At this point, a modified McCall suture was utilized using a 3-0  Vicryl, incorporating uterosacrals back of the vault and the sigmoid colon,  and this completely obliterated the cul-de-sac.  Prior to doing that, an  area of endometriosis in the cul-de-sac was identified and cauterized.  Two  sponge, needle, instrument counts were correct.  The peritoneum was closed  with a running 0 Vicryl.  Attention was next turned to the Burch.  The space  of Retzius could be easily entered and dissected free .Using 0 ethibond  so  that it would be a nonabsorbable suture two sutures were placed on either  side.  They were figure-of-eight sutures, incorporating vaginal tissue at  the urethrovesical angle, one more inferior than the other.  The first side  went well.  When the second  side was done, the bladder had stopped draining,  and a small nick was made in the bladder with the suture.  A lot of urine  that was in the bladder drained out.  The defect was closed with two 2-0  Vicryl sutures, burying the first layer with the second, and this created a  watertight closure and then the other sutures could be placed on the right  side periurethrally.  The bladder was then filled with a solution of sterile  saline with indigo carmine, and there was absolutely no leakage.  The  solution was then drained, and both sutures on either side could be nicely  sutured into her Cooper's ligament which were excellent, and a great  elevation of the urethrovesical angle was obtained.  Gelfoam was placed in  the space of Retzius for additional scarring effect as well as to control  bleeding.  A Blake drain was placed to drain the space of Retzius.  The  fascia was then closed with two running 0 Vicryl.  Subcutaneous tissue was  brought together to take some tension off the staples since the patient had  very little subcutaneous tissue and did not have much give after her  abdominoplasty, and then the skin was closed with staples.  The subcutaneous  suture was 2-0 plain.  Estimated blood loss for the entire procedure was 400  mL.  At the termination of procedure, the Foley was changed to a #18 with a  smaller balloon, and this was left to gravity drainage.  The patient left  the operating room in satisfactory condition.      Daniel L. Eda Paschal, M.D.  Electronically Signed     DLG/MEDQ  D:  02/09/2005  T:  02/09/2005  Job:  [id number]

## 2010-08-18 NOTE — Discharge Summary (Signed)
Monica Cunningham, ANGE                 ACCOUNT NO.:  [id number]   MEDICAL RECORD NO.:  [id number]          PATIENT TYPE:  INP   LOCATION:  1604                         FACILITY:  [facility]   PHYSICIAN:  Rande Brunt. Gottsegen, M.D.DATE OF BIRTH:  05-20-1960   DATE OF ADMISSION:  02/09/2005  DATE OF DISCHARGE:  02/12/2005                                 DISCHARGE SUMMARY   The patient is a 50 year old female who was admitted to the hospital with  dysmenorrhea, menorrhagia, endometriosis and urinary stress incontinence. On  the day at the hospital, she was taken to the operating room, a total  abdominal hysterectomy, fulguration of endometriosis and a Burch procedure  was done. During the dissection of the space of Retzius and placing of the  sutures, a small defect at the top of the fundus was made in the bladder. It  was closed in two layers without any difficulty. However, it was felt that  postoperatively we should continue her Foley and then do a postoperative  cystogram especially with her history of diabetes. The only other issue with  her postoperatively was that she started at 10 g of Hemoglobin, dropped to  7.7 and then eventually leveled off at 6.9. She could tolerated this well.  She was ready for discharge on the third postoperative day.   DISCHARGE MEDICATIONS:  Percocet for pain relief as well as ibuprofen. She  was also placed on Chromagen 1 daily for iron replacement.   We did a culture and sensitivity which will be ready when she comes back on  Thursday after her cystogram for removal of her Foley. The final pathology  report is not available at the time of dictation.   CONDITION ON DISCHARGE:  Improved.   DISCHARGE DIAGNOSES:  1.  Dysfunctional uterine bleeding with anemia.  2.  Dysmenorrhea.  3.  Endometriosis.  4.  Urinary stress incontinence.   OPERATION:  Total abdominal hysterectomy, fulguration of endometriosis,  McCall's elimination of the cul-de-sac to prevent  enterocele. Burch  procedure, repair of small cystotomy.      Daniel L. Eda Paschal, M.D.  Electronically Signed     DLG/MEDQ  D:  02/12/2005  T:  02/12/2005  Job:  [id number]

## 2010-08-18 NOTE — H&P (Signed)
   NAME:  Monica Cunningham, Monica Cunningham                           ACCOUNT NO.:  [id number]   MEDICAL RECORD NO.:  [id number]                   PATIENT TYPE:  INP   LOCATION:  NA                                   FACILITY:  [facility]   PHYSICIAN:  Timothy P. Fontaine, M.D.           DATE OF BIRTH:  31-Aug-1960   DATE OF ADMISSION:  11/13/2001  DATE OF DISCHARGE:                                HISTORY & PHYSICAL   CHIEF COMPLAINT:  1. Pregnancy at term.  2. History of prior cesarean section, desires repeat cesarean section.  3. Desires permanent sterilization.  4. History of positive beta Strep.  5. Gestational diabetes, insulin controlled with normal antepartum testing     and good glucose control.   HISTORY OF PRESENT ILLNESS:  The patient is a 50 year old, G4, P2, female at  term gestation.  History of prior cesarean section who desires repeat  cesarean section after counseling for trial of labor.  The patient also  desires permanent sterilization.  The patient is being followed for  gestational diabetes.  She is on insulin, and is in good control.  She has  normal antepartum testing.  The remainder of her history, see her hollister.   PHYSICAL EXAMINATION:  HEENT:  Normal.  LUNGS:  Clear.  CARDIAC:  Regular rate without murmurs, rubs, or gallops.  ABDOMEN:  Gravid fundus, consistent with term, positive fetal heart tones.  PELVIC:  Deferred.   ASSESSMENT:  A 64 -year-old with a history of prior cesarean section,  desires repeat cesarean section and tubal sterilization.  The risks,  benefits, alternatives, and indications for the procedure were reviewed with  the patient to include the risks of bleeding, transfusion, infection,  prolonged antibiotics, abscess drainage, wound complications, requiring  opening and draining of incisions, closure by secondary intention,  inadvertent injury to internal organs, including bladder, bowel, ureters,  vessels, and nerves, necessitating major exploratory repair  and surgery, and  _________ surgeries including ostomy formation.  The risks of fetal injury,  including musculoskeletal, neural, and scalpal were all reviewed,  understood, and accepted.  The permanency of the tubal sterilization, as  well as the potential for failure was reviewed with her, and she understands  and accepts this.  The patient's questions were answered to her  satisfaction.  She is ready to proceed with surgery.                                               Timothy P. Audie Box, M.D.    TPF/MEDQ  D:  11/07/2001  T:  11/08/2001  Job:  [id number]

## 2010-08-18 NOTE — Discharge Summary (Signed)
   NAME:  Monica Cunningham, Monica Cunningham NO.:  [id number]   MEDICAL RECORD NO.:  [id number]                   PATIENT TYPE:   LOCATION:                                       FACILITY:   PHYSICIAN:  Ivor Costa. Farrel Gobble, M.D.              DATE OF BIRTH:   DATE OF ADMISSION:  11/13/2001  DATE OF DISCHARGE:  11/16/2001                                 DISCHARGE SUMMARY   DISCHARGE DIAGNOSES:  1. Intrauterine pregnancy at 38+ weeks, delivery.  2. History of prior cesarean section, desires repeat cesarean section and     attempt at permanent sterilization.  3. History of positive Group B Strep.  4. Gestational diabetes, insulin controlled.  5. Status post repeat low transverse cesarean section.  6. Bilateral tubal sterilization, modified Pomeroy technique by Dr. Colin Broach on 11/13/01.   HISTORY OF PRESENT ILLNESS:  The patient is a 50 year old female, gravida 4,  para 2, with an EDC of 11/25/01.  Prenatal course was uncomplicated.  Because  of advanced maternal age, patient had amniocentesis which showed normal  __________.  History of prior cesarean section.  Desired repeat cesarean  section and desired attempt at permanent sterilization.  History of positive  Group B Strep, gestational diabetes, insulin controlled during pregnancy.   HOSPITAL COURSE:  On 11/13/01, the patient was admitted, underwent a repeat  low transverse cesarean section, bilateral tubal sterilization, modified  Pomeroy technique by Dr. Colin Broach and at 1255 underwent delivery of  a female infant with Apgar's of 9 and 9, weight of 8 pounds and 1 ounce.  There were no complications.  Postpartum, the patient remained afebrile,  voiding, in stable condition.  She was discharged home on 11/16/01, and given  [facility] instructions.   LABORATORY DATA:  The patient is AB+, rubella immune.  Hemoglobin on 11/14/01  was 10.8.   DISPOSITION:  The patient is discharged home,  is asked to return to the  office in six weeks.   DISCHARGE MEDICATIONS:  Percocet p.r.n. pain #30.     Susa Loffler, P.A.                    Ivor Costa. Farrel Gobble, M.D.    TSG/MEDQ  D:  12/26/2001  T:  12/26/2001  Job:  [id number]

## 2010-08-18 NOTE — H&P (Signed)
NAMEJAMONICA, Monica Cunningham                 ACCOUNT NO.:  [id number]   MEDICAL RECORD NO.:  [id number]          PATIENT TYPE:  INP   LOCATION:  NA                           FACILITY:  [facility]   PHYSICIAN:  Daniel L. Gottsegen, M.D.DATE OF BIRTH:  10/16/1960   DATE OF ADMISSION:  DATE OF DISCHARGE:                                HISTORY & PHYSICAL   Patient is for the operating room tomorrow morning, February 09, 2005 at  [facility] at 7:30 A.M.   CHIEF COMPLAINT:  Dysmenorrhea, menorrhagia, probable adenomyosis, urinary  stress incontinence.   HISTORY OF PRESENT ILLNESS:  The patient is a 50 year old gravida 4, para 2,  AB 3 who has presented to the office with persistent menometrorrhagia  associated with anemia requiring iron treatment, severe dysmenorrhea,  history of endometriosis previously diagnosed at the time of laparoscopy in  1997, and urinary stress incontinence.  She has tried oral contraceptives in  the past for some of the above problems without relief.  Saline infusion  hysterogram was done on her in September 2006 which revealed adenomyosis, no  ovarian pathology, no intracavity defects.  As a result of this persistence,  she now enters the hospital for total abdominal hysterectomy for treatment  of the above.  We plan to preserve her ovaries if they are normal.  She has  given me permission to remove one ovary for significant disease, especially  endometriosis but we will not remove both ovaries unless she has an  undiagnosed malignancy.  In addition, she is having trouble with losing  urine with coughing, laughing and sneezing.  She was seen by Dr. Alfredo Martinez of the urology department who did urodynamics and he felt that  for [place] to proceed with a Burch procedure at the same time would be  appropriate.   PAST MEDICAL HISTORY:  Previous laparoscopy in 1997 showing endometriosis,  Cesarean section, cholecystectomy, breast reduction with tummy tuck in April  2004, tubal ligation.   CURRENT MEDICATIONS:  Nonsteroidal anti-inflammatory drugs.   ALLERGIES:  CODEINE makes her nauseated.   FAMILY HISTORY:  Mother and aunt are diabetic.  Sister and mother are  hypertensive.  Maternal grandmother had uterine cancer.  Maternal  grandparents had coronary artery disease.   SOCIAL HISTORY:  She drinks alcohol occasionally.  She is a nonsmoker.   REVIEW OF SYSTEMS:  GENERAL:  Negative.  SKIN AND BREASTS:  Negative.  EYES,  EARS, NOSE, MOUTH AND THROAT:  Negative.  CARDIOVASCULAR:  Negative.  RESPIRATORY:  Negative.  GASTROINTESTINAL:  Negative.  MUSCULOSKELETAL:  Negative.  GENITOURINARY:  As above.  NEUROLOGICAL/PSYCHOLOGICAL:  Negative.  ALLERGIC/IMMUNOLOGIC/LYMPHATIC AND ENDOCRINE:  Negative except for nausea  with CODEINE.   PHYSICAL EXAMINATION:  GENERAL APPEARANCE: The patient is a well-developed,  well-nourished female in no acute distress.  VITAL SIGNS:  Blood pressure 138/86, pulse 80 and regular, respirations 16,  non labored.  She is afebrile.  HEENT:  All within normal limits.  NECK:  Supple.  Trachea is in the midline.  Thyroid is not enlarged.  LUNGS:  Clear to auscultation and percussion.  HEART:  No thrills, heaves, murmurs.  BREASTS:  No masses.  ABDOMEN:  Soft without guarding, rebound or masses.  PELVIC:  External and vaginal normal.  Cervix is clean.  Pap smear shows no  atypia.  Uterus is top normal size and shape.  Adnexa are not palpably  enlarged.  Rectovaginal negative.  EXTREMITIES:  Within normal limits.   ADMISSION IMPRESSION:  1.  Severe dysmenorrhea with menorrhagia, adenomyosis suspected.  2.  Urinary stress incontinence.   PLAN:  As noted above.      Daniel L. Eda Paschal, M.D.  Electronically Signed     DLG/MEDQ  D:  02/08/2005  T:  02/08/2005  Job:  [id number]

## 2010-08-18 NOTE — Op Note (Signed)
NAME:  Monica Cunningham, Monica Cunningham                           ACCOUNT NO.:  [id number]   MEDICAL RECORD NO.:  [id number]                   PATIENT TYPE:  INP   LOCATION:  9124                                 FACILITY:  [facility]   PHYSICIAN:  Timothy P. Fontaine, M.D.           DATE OF BIRTH:  04/02/1961   DATE OF PROCEDURE:  11/13/2001  DATE OF DISCHARGE:                                 OPERATIVE REPORT   PREOPERATIVE DIAGNOSES:  1. Pregnancy at term.  2. History of prior cesarean section.  3. Desires repeat cesarean section.  4. Desires permanent sterilization.  5. History of positive beta strep.  6. Gestational diabetes, insulin controlled with normal antepartum testing,     good glucose control.   POSTOPERATIVE DIAGNOSES:  1. Pregnancy at term.  2. History of prior cesarean section.  3. Desires repeat cesarean section.  4. Desires permanent sterilization.  5. History of positive beta strep.  6. Gestational diabetes, insulin controlled with normal antepartum testing,     good glucose control.   PROCEDURE:  1. Repeat low transverse cervical cesarean section.  2. Bilateral tubal sterilization, modified Pomeroy technique.   SURGEON:  Timothy P. Fontaine, M.D.   ASSISTANTBenjamine Mola M. Ciliberti, M.D.   ANESTHESIA:  Spinal.   ESTIMATED BLOOD LOSS:  Less than 500 cc.   COMPLICATIONS:  None.   SPECIMENS:  Portions of right and portions of left fallopian tube, placenta,  cord blood.   FINDINGS:  At 12:55 normal female infant, Apgars 9 and 9, weight 8 pounds 1  ounce.  Pelvic anatomy noted to be normal.  Apgars 9 and 9.  Weight 8 pounds  1 ounce.  Pelvic anatomy noted to be normal.   DESCRIPTION OF PROCEDURE:  The patient was taken to the operating room and  underwent spinal anesthesia.  She as placed in the left tilt supine position  where she received a Betadine preparation with Betadine solution.  The  bladder was emptied with an indwelling Foley catheter placed in sterile  technique.   The patient was draped in the usual fashion.  After assuring  adequate anesthesia, the abdomen was sharply entered through a repeat  Pfannenstiel incision achieving adequate hemostasis at all levels.  The  bladder flap was sharply and bluntly developed without difficulty.  The  uterus was sharply entered in the lower uterine segment and  bluntly  extended laterally.  The bulging membranes were ruptured, the fluid noted to  be clear.  The infant's head  was delivered through the incision.  The nares  and mouth were suctioned.  The rest of the infant was delivered.  The cord  was doubly clamped and cut, and the infant handed to pediatrics in  attendance.  Samples of cord blood were obtained.  The placenta was  spontaneously extruded, noted to be intact, and was sent to pathology.  The  patient received 1 g Ancef antibiotic prophylaxis.  The uterus was  exteriorized and the endometrial cavity explored with a sponge to remove all  placental and membrane fragments.  The uterine incision was then closed in  one layer using 0 Vicryl suture in a running interlocking stitch.  Several  interrupted figure-of-eight sutures were then placed to achieve ultimate  hemostasis.  The right fallopian tube was identified, traced from its  insertion to its fimbriated end and a mid tubal segment was doubly ligated  using 0 plain suture, and the intervening segment was excised.  The tubal  lumen as well as adequate hemostasis was visualized grossly.  A similar  procedure was carried out on the other side.  The uterus was then returned  to the abdomen.  Both tubal pedicles were inspected showing adequate  hemostasis , the pelvis irrigated.  Adequate hemostasis visualized, and the  anterior fascia was then reapproximated using 0 Vicryl suture in a running  stitch, starting at the angle and meeting in the middle.  Subcutaneous  tissues were irrigated and adequate hemostasis achieved with electrocautery,  and the skin  was then reapproximated using staples with intervening Steri-  Strips and Benzoin application.  The sterile dressing was applied.  The  patient was taken to the recovery room in good condition having tolerated  the procedure well.                                               Timothy P. Audie Box, M.D.    TPF/MEDQ  D:  11/13/2001  T:  11/14/2001  Job:  [id number]

## 2010-10-11 ENCOUNTER — Telehealth: Payer: Self-pay | Admitting: Family Medicine

## 2010-10-11 DIAGNOSIS — N6320 Unspecified lump in the left breast, unspecified quadrant: Secondary | ICD-10-CM

## 2010-10-11 NOTE — Telephone Encounter (Signed)
Pt has knot in her breast that Dr Tawanna Cooler knows about. Pt would like to have a referral for another mammogram to [facility].

## 2010-10-12 ENCOUNTER — Other Ambulatory Visit: Payer: Self-pay | Admitting: Family Medicine

## 2010-10-12 DIAGNOSIS — N6320 Unspecified lump in the left breast, unspecified quadrant: Secondary | ICD-10-CM

## 2010-10-12 NOTE — Telephone Encounter (Signed)
Order sent and patient aware

## 2010-10-12 NOTE — Telephone Encounter (Signed)
Ok

## 2010-11-21 ENCOUNTER — Other Ambulatory Visit (INDEPENDENT_AMBULATORY_CARE_PROVIDER_SITE_OTHER): Payer: BC Managed Care – PPO

## 2010-11-21 DIAGNOSIS — Z0000 Encounter for general adult medical examination without abnormal findings: Secondary | ICD-10-CM

## 2010-11-21 LAB — CBC WITH DIFFERENTIAL/PLATELET
Basophils Relative: 0.8 % (ref 0.0–3.0)
Eosinophils Absolute: 0.2 10*3/uL (ref 0.0–0.7)
Eosinophils Relative: 2.2 % (ref 0.0–5.0)
Lymphocytes Relative: 26 % (ref 12.0–46.0)
MCHC: 33.7 g/dL (ref 30.0–36.0)
Neutrophils Relative %: 62.4 % (ref 43.0–77.0)
RBC: 4.58 Mil/uL (ref 3.87–5.11)
WBC: 9 10*3/uL (ref 4.5–10.5)

## 2010-11-21 LAB — LIPID PANEL
HDL: 46.4 mg/dL (ref >39.00)
LDL Cholesterol: 109 mg/dL — ABNORMAL HIGH (ref 0–99)
Total CHOL/HDL Ratio: 4
Triglycerides: 194 mg/dL — ABNORMAL HIGH (ref 0.0–149.0)

## 2010-11-21 LAB — HEPATIC FUNCTION PANEL
Alkaline Phosphatase: 44 U/L (ref 39–117)
Bilirubin, Direct: 0.2 mg/dL (ref 0.0–0.3)
Total Protein: 7.3 g/dL (ref 6.0–8.3)

## 2010-11-21 LAB — POCT URINALYSIS DIPSTICK
Glucose, UA: NEGATIVE
Nitrite, UA: NEGATIVE
Protein, UA: NEGATIVE
Urobilinogen, UA: 0.2
pH, UA: 5

## 2010-11-21 LAB — BASIC METABOLIC PANEL
Calcium: 9.7 mg/dL (ref 8.4–10.5)
Creatinine, Ser: 1 mg/dL (ref 0.4–1.2)
GFR: 62.98 mL/min (ref >60.00)

## 2010-11-24 ENCOUNTER — Other Ambulatory Visit: Payer: Self-pay | Admitting: *Deleted

## 2010-11-24 MED ORDER — SIMVASTATIN 20 MG PO TABS: 20.0000 mg | ORAL_TABLET | Freq: Every day | ORAL | Status: DC

## 2010-11-28 ENCOUNTER — Ambulatory Visit (INDEPENDENT_AMBULATORY_CARE_PROVIDER_SITE_OTHER): Payer: BC Managed Care – PPO | Admitting: Family Medicine

## 2010-11-28 ENCOUNTER — Other Ambulatory Visit: Payer: Self-pay | Admitting: *Deleted

## 2010-11-28 ENCOUNTER — Encounter: Payer: Self-pay | Admitting: Family Medicine

## 2010-11-28 DIAGNOSIS — I10 Essential (primary) hypertension: Secondary | ICD-10-CM

## 2010-11-28 DIAGNOSIS — N6320 Unspecified lump in the left breast, unspecified quadrant: Secondary | ICD-10-CM

## 2010-11-28 DIAGNOSIS — J309 Allergic rhinitis, unspecified: Secondary | ICD-10-CM

## 2010-11-28 DIAGNOSIS — E785 Hyperlipidemia, unspecified: Secondary | ICD-10-CM

## 2010-11-28 DIAGNOSIS — Z0000 Encounter for general adult medical examination without abnormal findings: Secondary | ICD-10-CM

## 2010-11-28 DIAGNOSIS — L719 Rosacea, unspecified: Secondary | ICD-10-CM

## 2010-11-28 DIAGNOSIS — N63 Unspecified lump in unspecified breast: Secondary | ICD-10-CM

## 2010-11-28 DIAGNOSIS — E282 Polycystic ovarian syndrome: Secondary | ICD-10-CM

## 2010-11-28 MED ORDER — AZELAIC ACID 15 % EX GEL: CUTANEOUS | Status: DC

## 2010-11-28 MED ORDER — SIMVASTATIN 20 MG PO TABS: 20.0000 mg | ORAL_TABLET | Freq: Every day | ORAL | Status: DC

## 2010-11-28 MED ORDER — SULFACETAMIDE SODIUM-SULFUR 10-5 % EX EMUL: CUTANEOUS | Status: DC

## 2010-11-28 MED ORDER — LOSARTAN POTASSIUM-HCTZ 100-25 MG PO TABS: 1.0000 | ORAL_TABLET | Freq: Two times a day (BID) | ORAL | Status: DC

## 2010-11-28 MED ORDER — METFORMIN HCL ER 500 MG PO TB24: ORAL_TABLET | ORAL | Status: DC

## 2010-11-28 MED ORDER — MOMETASONE FUROATE 50 MCG/ACT NA SUSP: 2.0000 | Freq: Every day | NASAL | Status: DC

## 2010-11-28 MED ORDER — CLONAZEPAM 1 MG PO TABS: 1.0000 mg | ORAL_TABLET | Freq: Two times a day (BID) | ORAL | Status: DC | PRN

## 2010-11-28 MED ORDER — METFORMIN HCL ER (MOD) 500 MG PO TB24: ORAL_TABLET | ORAL | Status: DC

## 2010-11-28 NOTE — Patient Instructions (Signed)
Continue your current medications.  Follow-up in one year, sooner if any problem 

## 2010-11-28 NOTE — Progress Notes (Signed)
  Subjective:    Patient ID: Monica Cunningham, female    DOB: 1960-08-30, 50 y.o.   MRN: [id number]  Monica Cunningham is a 50 year old, married female, divorced G2, P2, who comes in today for general medical examination because of a history of sleep dysfunction, hypertension, rosacea, allergic rhinitis, hyperlipidemia, and polycystic ovarian syndrome.  She takes an 81-mg baby aspirin daily.  She is on two creams from Dr. Mayford Knife for her rosacea.  I will rewrite the since he retired.  She takes Klonopin 1 mg nightly for sleep dysfunction.  She takes Hyzaar 100 -- 25 daily for hypertension.  BP 110/80.  She takes metformin 500 mg two tabs b.i.d. For B. COS A1c6 .0%.  She takes Nasonex nasal spray nightly for allergic rhinitis.  She also takes Zocor 20 mg nightly for hyperlipidemia.  She's had a breast reduction.  No problems except for little cystic lesion on the incision in her left nipple at the 9 o'clock position.  She does have light skin and light eyes.  Advised to get annual eye exams because of glaucoma.  That runs in her family, where dental care, BSE monthly, and a mammography, colonoscopy, a couple years ago, because her father has a history of colon polyps.  A colonoscopy was normal, Pap by GYN    Review of Systems  Constitutional: Negative.   HENT: Negative.   Eyes: Negative.   Respiratory: Negative.   Cardiovascular: Negative.   Gastrointestinal: Negative.   Genitourinary: Negative.   Musculoskeletal: Negative.   Neurological: Negative.   Hematological: Negative.   Psychiatric/Behavioral: Negative.        Objective:   Physical Exam  Constitutional: She appears well-developed and well-nourished.  HENT:  Head: Normocephalic and atraumatic.  Right Ear: External ear normal.  Left Ear: External ear normal.  Nose: Nose normal.  Mouth/Throat: Oropharynx is clear and moist.  Eyes: EOM are normal. Pupils are equal, round, and reactive to light.  Neck: Normal range of motion.  Neck supple. No thyromegaly present.  Cardiovascular: Normal rate, regular rhythm, normal heart sounds and intact distal pulses.  Exam reveals no gallop and no friction rub.   No murmur heard. Pulmonary/Chest: Effort normal and breath sounds normal.  Abdominal: Soft. Bowel sounds are normal. She exhibits no distension and no mass. There is no tenderness. There is no rebound.  Genitourinary:       Bilateral breast exam shows scars from previous breast reduction surgery.  There is an incisional cyst in the left breast at the 9 o'clock position.  Musculoskeletal: Normal range of motion.  Lymphadenopathy:    She has no cervical adenopathy.  Neurological: She is alert. She has normal reflexes. No cranial nerve deficit. She exhibits normal muscle tone. Coordination normal.  Skin: Skin is warm and dry.       Total body skin exam normal  Psychiatric: She has a normal mood and affect. Her behavior is normal. Judgment and thought content normal.          Assessment & Plan:  Healthy female.  History of rosacea continue current medications.  History of sleep dysfunction.  Continue Klonopin 1 mg nightly  Hypertension.  Continue Hyzaar 125 daily.  PCL S. Continue metformin 500 mg two tabs b.i.d.  Allergic rhinitis.  Continue Nasonex nasal spray.  Hyperlipidemia.  Continue Zocor 20 nightly along with a baby aspirin.  Advised to wear sunscreens do breast exam monthly.  Return in one year for general physical exam

## 2010-11-28 NOTE — Telephone Encounter (Signed)
rx clarification

## 2011-01-02 ENCOUNTER — Encounter: Payer: Self-pay | Admitting: Internal Medicine

## 2011-01-08 ENCOUNTER — Telehealth: Payer: Self-pay | Admitting: Family Medicine

## 2011-01-08 NOTE — Telephone Encounter (Signed)
Pt req call back from Dr Tawanna Cooler re: confidential matter. Pt would not give detail.

## 2011-01-09 NOTE — Telephone Encounter (Signed)
Monica Cunningham is calling because her son that went to her ex-husband's house and found the marijuana.  Her ex-husband told her them.  It was medical marijuana.  Monica Cunningham wants to know if medical marijuana is dispensed in [place].  They told her as far as I know there is no medical marijuana in [place]

## 2011-02-02 ENCOUNTER — Ambulatory Visit (INDEPENDENT_AMBULATORY_CARE_PROVIDER_SITE_OTHER): Payer: BC Managed Care – PPO | Admitting: Internal Medicine

## 2011-02-02 ENCOUNTER — Encounter: Payer: Self-pay | Admitting: Internal Medicine

## 2011-02-02 VITALS — BP 100/64 | HR 80 | Ht 61.5 in | Wt 162.0 lb

## 2011-02-02 DIAGNOSIS — R195 Other fecal abnormalities: Secondary | ICD-10-CM

## 2011-02-02 NOTE — Progress Notes (Signed)
  Subjective:    Patient ID: Monica Cunningham, female    DOB: 26-Oct-1960, 50 y.o.   MRN: [id number]  HPI this is a pleasant woman who had Hemoccult cards performed through her gynecologist. One of the 3 cards showed one very small area of heme positive stool. These are the type of cards that are dropped into the toilet. She said she was careful not to urinate as she does have chronic recurrent hematuria problems. She has absolutely no gastrointestinal symptoms at this time including rectal bleeding or melena. I had performed a colonoscopy in 2010 which was normal.    Review of Systems Back pain, hematuria, allergies.    Objective:   Physical Exam Developed well-nourished no acute distress       Assessment & Plan:  Heme positive stool  She has a spot of heme positivity on 3 cards. I looked at the images of the cards applied. She is actually no symptoms yet a normal colonoscopy in 2010. I believe this is most likely a false positive. We talked about this and she understands that. I do not recommend a repeat colonoscopy at this time. In fact she really does not need routine Hemoccult cards since she had a colonoscopy already and these really should not start until the age of 11 anyway. In her case, one could consider starting Hemoccult around 5-6 years after her last colonoscopy and do that annually. I would suggest an immune fecal occult blood test and not the type Hemoccult cards as a false positive the great his higher and they're less accurate. She is due for a routine repeat colonoscopy in 2020. She does understand that no test is perfect and that there is a possibility she could have significant gastrointestinal problems developing that could lead to this trace heme positive stool but that is extremely unlikely. I specifically mentioned the possibility of cancer but thought that that was very unlikely.

## 2011-02-02 NOTE — Patient Instructions (Signed)
You do not need a colonoscopy for the heme positive stool. You really do not need routine yearly Hemoccults but if you want to start, would do that 5 years after your last colonoscopy which would be in 2015 or even 2016.

## 2011-02-14 ENCOUNTER — Encounter: Payer: Self-pay | Admitting: Internal Medicine

## 2011-02-14 NOTE — Progress Notes (Signed)
This encounter was created in error - please disregard.

## 2011-03-30 ENCOUNTER — Other Ambulatory Visit: Payer: Self-pay | Admitting: Family Medicine

## 2011-04-05 ENCOUNTER — Other Ambulatory Visit: Payer: Self-pay | Admitting: Family Medicine

## 2011-05-28 ENCOUNTER — Other Ambulatory Visit: Payer: Self-pay | Admitting: Family Medicine

## 2011-11-14 ENCOUNTER — Other Ambulatory Visit: Payer: Self-pay | Admitting: Obstetrics and Gynecology

## 2011-11-14 DIAGNOSIS — Z1231 Encounter for screening mammogram for malignant neoplasm of breast: Secondary | ICD-10-CM

## 2011-11-23 ENCOUNTER — Encounter: Payer: Self-pay | Admitting: Gynecology

## 2011-11-23 ENCOUNTER — Ambulatory Visit (INDEPENDENT_AMBULATORY_CARE_PROVIDER_SITE_OTHER): Payer: BC Managed Care – PPO | Admitting: Obstetrics and Gynecology

## 2011-11-23 DIAGNOSIS — N9089 Other specified noninflammatory disorders of vulva and perineum: Secondary | ICD-10-CM

## 2011-11-23 DIAGNOSIS — N809 Endometriosis, unspecified: Secondary | ICD-10-CM | POA: Insufficient documentation

## 2011-11-23 MED ORDER — SULFAMETHOXAZOLE-TRIMETHOPRIM 800-160 MG PO TABS: 1.0000 | ORAL_TABLET | Freq: Two times a day (BID) | ORAL | Status: AC

## 2011-11-23 NOTE — Patient Instructions (Signed)
Return Monday if no better.

## 2011-11-23 NOTE — Progress Notes (Signed)
Patient came to see me to day with a one-week history of what she thought was a right Bartholin's cyst. It is both tender and painful. She has used no medication.  Exam: Kennon Portela present. External reveals a small labial abscess on right inner labia. It is approximately 2 cm by half a centimeter. It is tender with skin cellulitis. There is very little fluctuance and I doubt if I opened it  I would get much drainage.  Assessment: Small labial abscess  Plan: Warm soaks 4 times a day. Septra DS twice a day for 7 days. Return to the office Monday if no improvement. Return later in the week if it does not completely resolve.

## 2011-11-30 ENCOUNTER — Telehealth: Payer: Self-pay | Admitting: *Deleted

## 2011-11-30 MED ORDER — SULFAMETHOXAZOLE-TMP DS 800-160 MG PO TABS: 1.0000 | ORAL_TABLET | Freq: Two times a day (BID) | ORAL | Status: AC

## 2011-11-30 NOTE — Telephone Encounter (Signed)
Pt was given bactrim d twice daily x 7 days. Pt forgot to take medication 2 nights in a row. She has 2 pills left and her symptoms are starting to return. Pt asked if she could have another round of medication due to long weekend. Please advise

## 2011-11-30 NOTE — Telephone Encounter (Signed)
Pt informed with the below note, rx sent to pharmacy. 

## 2011-11-30 NOTE — Telephone Encounter (Signed)
Please call and tell her to take last 2 doses today. Review often  Can take 3 day round of Bactrim.

## 2011-12-28 ENCOUNTER — Other Ambulatory Visit (HOSPITAL_COMMUNITY)
Admission: RE | Admit: 2011-12-28 | Discharge: 2011-12-28 | Disposition: A | Discharge status: Home / Self Care | Payer: BC Managed Care – PPO | Source: Ambulatory Visit | Attending: Obstetrics and Gynecology | Admitting: Obstetrics and Gynecology

## 2011-12-28 ENCOUNTER — Encounter: Payer: Self-pay | Admitting: Obstetrics and Gynecology

## 2011-12-28 ENCOUNTER — Ambulatory Visit (INDEPENDENT_AMBULATORY_CARE_PROVIDER_SITE_OTHER): Payer: BC Managed Care – PPO | Admitting: Obstetrics and Gynecology

## 2011-12-28 VITALS — BP 134/88 | Ht 62.0 in | Wt 174.0 lb

## 2011-12-28 DIAGNOSIS — Z01419 Encounter for gynecological examination (general) (routine) without abnormal findings: Secondary | ICD-10-CM | POA: Insufficient documentation

## 2011-12-28 DIAGNOSIS — Z1151 Encounter for screening for human papillomavirus (HPV): Secondary | ICD-10-CM | POA: Insufficient documentation

## 2011-12-28 NOTE — Patient Instructions (Signed)
Make an appointment with Dr. Yehuda Mao.

## 2011-12-28 NOTE — Progress Notes (Signed)
Patient came to see me today for her annual GYN exam. She is status post TAH, Burch procedure done for endometriosis, fibroids and urinary stress incontinence in 2006. At one point she was having hot flashes and in 2011 her [facility] was 7. She currently is asymptomatic. She had questions regarding her risks for throat cancer since her ex-husband had throat cancer and they had oral sex. She has always had normal Pap smears. There is no record that she was ever checked for high-risk HPV. Over year ago she found a small nodule on her left areola at the site of the incision of her reduction mammoplasty. She had a previous one excised by Dr. Yehuda Mao who was her plastic surgeon. She is scheduled for a mammogram on Monday. In addition for 6 months she has felt an intermittent lump when she stands at the side of her incision of her cesarean sections, abdominoplasty, and total abdominal hysterectomy. She is having no vaginal bleeding. She is having no pelvic pain. Her last Pap smear was 2012.  HEENT: Within normal limits.Leonard Schwartz present. Neck: No masses. Supraclavicular lymph nodes: Not enlarged. Breasts: Examined in both sitting and lying position. Symmetrical without skin changes or masses. At 9:00 at the site of the incision for reduction mammoplasty in the areola there is a half centimeter firm nodule in the skin not in the breast tissue. Abdomen: Soft no masses guarding or rebound. When the patient stands and does a Valsalva there is a 2 cm defect at the lateral edge of her incision consistent with a small hernia. Pelvic: External within normal limits. BUS within normal limits. Vaginal examination shows good estrogen effect, no cystocele enterocele or rectocele. Cervix and uterus absent. Adnexa within normal limits. Rectovaginal confirmatory.  Assessment: #1. Nodule at site of reduction mammoplasty. #2. Small incisional hernia #3. Concern regarding throat  Cancer.  Plan: We discussed the issues with  high-risk HPV and throat cancer. The patient requested pap and HPV testing which was done. She will continue to get careful throat exams both from her internist and dentist. She will get a mammogram on Monday. She will show them the lesion that she showed me on her left breast. She will go back and see Dr. Yehuda Mao and show him  both the breast lesion and the possible incisional hernia and get his assessment.The new Pap smear guidelines were discussed with the patient.  Extremities within normal limits.

## 2011-12-29 LAB — URINALYSIS W MICROSCOPIC + REFLEX CULTURE
Casts: NONE SEEN
Crystals: NONE SEEN
Ketones, ur: NEGATIVE mg/dL
Leukocytes, UA: NEGATIVE
Nitrite: NEGATIVE
Specific Gravity, Urine: 1.022 (ref 1.005–1.030)
Squamous Epithelial / LPF: NONE SEEN
pH: 6 (ref 5.0–8.0)

## 2011-12-31 ENCOUNTER — Ambulatory Visit
Admission: RE | Admit: 2011-12-31 | Discharge: 2011-12-31 | Disposition: A | Discharge status: Home / Self Care | Payer: BC Managed Care – PPO | Source: Ambulatory Visit | Attending: Obstetrics and Gynecology | Admitting: Obstetrics and Gynecology

## 2011-12-31 DIAGNOSIS — Z1231 Encounter for screening mammogram for malignant neoplasm of breast: Secondary | ICD-10-CM

## 2012-01-01 ENCOUNTER — Telehealth: Payer: Self-pay | Admitting: Obstetrics and Gynecology

## 2012-01-01 ENCOUNTER — Other Ambulatory Visit: Payer: Self-pay | Admitting: Obstetrics and Gynecology

## 2012-01-01 MED ORDER — FLUCONAZOLE 150 MG PO TABS: ORAL_TABLET | ORAL | Status: DC

## 2012-01-01 MED ORDER — TERCONAZOLE 0.8 % VA CREA: 1.0000 | TOPICAL_CREAM | Freq: Every day | VAGINAL | Status: DC

## 2012-01-01 NOTE — Telephone Encounter (Signed)
okay

## 2012-01-01 NOTE — Telephone Encounter (Signed)
Patient was in office to see you last Friday. Patient said you usually treat her for yeast over the phone as she is diabetic and has a lot of issues with vaginal yeast infections. She wonders since you are retiring if you would go ahead and prescribe the Diflucan 150 #3 tabs (takes 1 q 3 days) and Terazol 3 Cream and put  6 refills on both.

## 2012-02-11 ENCOUNTER — Telehealth: Payer: Self-pay | Admitting: Family Medicine

## 2012-02-11 DIAGNOSIS — I10 Essential (primary) hypertension: Secondary | ICD-10-CM

## 2012-02-11 DIAGNOSIS — E785 Hyperlipidemia, unspecified: Secondary | ICD-10-CM

## 2012-02-11 MED ORDER — CLONAZEPAM 1 MG PO TABS: 1.0000 mg | ORAL_TABLET | Freq: Every day | ORAL | Status: DC | PRN

## 2012-02-11 MED ORDER — LOSARTAN POTASSIUM-HCTZ 100-25 MG PO TABS: 1.0000 | ORAL_TABLET | Freq: Two times a day (BID) | ORAL | Status: DC

## 2012-02-11 MED ORDER — METFORMIN HCL ER 500 MG PO TB24: ORAL_TABLET | ORAL | Status: DC

## 2012-02-11 MED ORDER — SIMVASTATIN 20 MG PO TABS: 20.0000 mg | ORAL_TABLET | Freq: Every day | ORAL | Status: DC

## 2012-02-11 NOTE — Telephone Encounter (Signed)
Pt scheduled for cpx 04/22/12, requesting 90 day supply of the following meds until she can comes in for cpx:  Metformin 500mg   Simvastatin  hyzaar 100-25  clorazapam 1 mg   Pharmacy: [facility]

## 2012-02-15 ENCOUNTER — Other Ambulatory Visit: Payer: Self-pay | Admitting: Family Medicine

## 2012-02-15 MED ORDER — SIMVASTATIN 20 MG PO TABS: 20.0000 mg | ORAL_TABLET | Freq: Every day | ORAL | Status: DC

## 2012-02-15 MED ORDER — METFORMIN HCL ER 500 MG PO TB24: ORAL_TABLET | ORAL | Status: DC

## 2012-02-15 MED ORDER — LOSARTAN POTASSIUM-HCTZ 100-25 MG PO TABS: 1.0000 | ORAL_TABLET | Freq: Two times a day (BID) | ORAL | Status: DC

## 2012-02-15 NOTE — Telephone Encounter (Signed)
Pt called and said that there are problems with all the scripts that were sent to pharmacy. The Metformin should be for #360, because pt take 2 in a.m and 2 in the pm, 90 day supply. The Simvastatin instructions are incorrect, should say OR instead of And. The [place] instructions are incorrect, should say 1 tab by mouth twice day for #180, do not say every evening.

## 2012-04-15 ENCOUNTER — Other Ambulatory Visit (INDEPENDENT_AMBULATORY_CARE_PROVIDER_SITE_OTHER): Payer: BC Managed Care – PPO

## 2012-04-15 DIAGNOSIS — Z0000 Encounter for general adult medical examination without abnormal findings: Secondary | ICD-10-CM

## 2012-04-15 LAB — BASIC METABOLIC PANEL
BUN: 27 mg/dL — ABNORMAL HIGH (ref 6–23)
CO2: 27 mEq/L (ref 19–32)
Calcium: 9.8 mg/dL (ref 8.4–10.5)
GFR: 67.25 mL/min (ref >60.00)
Glucose, Bld: 163 mg/dL — ABNORMAL HIGH (ref 70–99)
Potassium: 4.2 mEq/L (ref 3.5–5.1)
Sodium: 138 mEq/L (ref 135–145)

## 2012-04-15 LAB — CBC WITH DIFFERENTIAL/PLATELET
Basophils Absolute: 0.1 10*3/uL (ref 0.0–0.1)
Eosinophils Absolute: 0.2 10*3/uL (ref 0.0–0.7)
HCT: 45.1 % (ref 36.0–46.0)
Hemoglobin: 15.1 g/dL — ABNORMAL HIGH (ref 12.0–15.0)
Lymphs Abs: 2.4 10*3/uL (ref 0.7–4.0)
MCHC: 33.6 g/dL (ref 30.0–36.0)
Neutro Abs: 3.7 10*3/uL (ref 1.4–7.7)
Platelets: 193 10*3/uL (ref 150.0–400.0)
RDW: 12.7 % (ref 11.5–14.6)

## 2012-04-15 LAB — HEPATIC FUNCTION PANEL
Albumin: 4.3 g/dL (ref 3.5–5.2)
Total Bilirubin: 1.3 mg/dL — ABNORMAL HIGH (ref 0.3–1.2)

## 2012-04-15 LAB — POCT URINALYSIS DIPSTICK
Leukocytes, UA: NEGATIVE
Nitrite, UA: NEGATIVE
Urobilinogen, UA: 0.2
pH, UA: 5.5

## 2012-04-15 LAB — HEMOGLOBIN A1C: Hgb A1c MFr Bld: 7.2 % — ABNORMAL HIGH (ref 4.6–6.5)

## 2012-04-15 LAB — LIPID PANEL
Cholesterol: 223 mg/dL — ABNORMAL HIGH (ref 0–200)
Total CHOL/HDL Ratio: 5
VLDL: 61.8 mg/dL — ABNORMAL HIGH (ref 0.0–40.0)

## 2012-04-15 LAB — LDL CHOLESTEROL, DIRECT: Direct LDL: 120.9 mg/dL

## 2012-04-15 LAB — TESTOSTERONE: Testosterone: 51 ng/dL (ref 10.00–70.00)

## 2012-04-15 LAB — TSH: TSH: 2.64 u[IU]/mL (ref 0.35–5.50)

## 2012-04-22 ENCOUNTER — Ambulatory Visit (INDEPENDENT_AMBULATORY_CARE_PROVIDER_SITE_OTHER): Payer: BC Managed Care – PPO | Admitting: Family Medicine

## 2012-04-22 ENCOUNTER — Encounter: Payer: Self-pay | Admitting: Family Medicine

## 2012-04-22 VITALS — BP 120/84 | Temp 98.7°F | Ht 61.25 in | Wt 180.0 lb

## 2012-04-22 DIAGNOSIS — N6320 Unspecified lump in the left breast, unspecified quadrant: Secondary | ICD-10-CM

## 2012-04-22 DIAGNOSIS — I10 Essential (primary) hypertension: Secondary | ICD-10-CM

## 2012-04-22 DIAGNOSIS — E785 Hyperlipidemia, unspecified: Secondary | ICD-10-CM

## 2012-04-22 DIAGNOSIS — E1151 Type 2 diabetes mellitus with diabetic peripheral angiopathy without gangrene: Secondary | ICD-10-CM | POA: Insufficient documentation

## 2012-04-22 DIAGNOSIS — I70209 Unspecified atherosclerosis of native arteries of extremities, unspecified extremity: Secondary | ICD-10-CM

## 2012-04-22 DIAGNOSIS — N63 Unspecified lump in unspecified breast: Secondary | ICD-10-CM

## 2012-04-22 DIAGNOSIS — Z87448 Personal history of other diseases of urinary system: Secondary | ICD-10-CM

## 2012-04-22 DIAGNOSIS — E282 Polycystic ovarian syndrome: Secondary | ICD-10-CM

## 2012-04-22 DIAGNOSIS — J309 Allergic rhinitis, unspecified: Secondary | ICD-10-CM

## 2012-04-22 DIAGNOSIS — E1159 Type 2 diabetes mellitus with other circulatory complications: Secondary | ICD-10-CM

## 2012-04-22 MED ORDER — LOSARTAN POTASSIUM-HCTZ 100-25 MG PO TABS: 1.0000 | ORAL_TABLET | Freq: Two times a day (BID) | ORAL | Status: DC

## 2012-04-22 MED ORDER — METFORMIN HCL ER 500 MG PO TB24: ORAL_TABLET | ORAL | Status: DC

## 2012-04-22 MED ORDER — SIMVASTATIN 20 MG PO TABS: 20.0000 mg | ORAL_TABLET | Freq: Every day | ORAL | Status: DC

## 2012-04-22 MED ORDER — MONTELUKAST SODIUM 10 MG PO TABS: ORAL_TABLET | ORAL | Status: DC

## 2012-04-22 MED ORDER — CLONAZEPAM 1 MG PO TABS: 1.0000 mg | ORAL_TABLET | Freq: Every day | ORAL | Status: DC | PRN

## 2012-04-22 NOTE — Patient Instructions (Signed)
Begin a calorie counting diet  Avoid sodium  Walk 15 minutes daily  Return the first week in May for followup  Nonfasting labs one week prior  Continue your other medications

## 2012-04-22 NOTE — Progress Notes (Signed)
Subjective:    Patient ID: Monica Cunningham, female    DOB: 1960/06/12, 52 y.o.   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 and takes metformin 500 mg 2 tabs twice daily recent blood sugars have been going up now in the 163 range A1c a year ago was 6.0 not 7.2 she also has microalbuminuria. She's gained weight since the fall and is not exercising.  She takes Zocor 20 mg daily for hyperlipidemia  She takes Singulair 10 mg daily for allergic rhinitis  She takes Hyzaar 100-25 1 tab twice a day for hypertension BP 120/84  She takes Klonopin 1 mg each bedtime when necessary for anxiety  She also uses some creams for her facial condition  She gets routine eye care, dental care, BSE mostly monthly, and you mammography, colonoscopy 2008 normal.  Her concerns are change in the diameter of her stools they're flat inset around. Colonoscopy 5 years ago normal. No other change in bowel habits  She also has some shortness of breath but not with exertion. She's also having difficulty with headaches in a recently has had some increased headaches after dental work.  Since that summer she's had 3 episodes of right lower quadrant pain that came and went.  She also some thoracic back pain that comes and goes.  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    Review of Systems  Constitutional: Negative.   HENT: Negative.   Eyes: Negative.   Respiratory: Negative.   Cardiovascular: Negative.   Gastrointestinal: Negative.   Genitourinary: Negative.   Musculoskeletal: Negative.   Neurological: Negative.   Hematological: Negative.   Psychiatric/Behavioral: Negative.        Objective:   Physical Exam  Constitutional: She appears well-developed and  well-nourished.  HENT:  Head: Normocephalic and atraumatic.  Right Ear: External ear normal.  Left Ear: External ear normal.  Nose: Nose normal.  Mouth/Throat: Oropharynx is clear and moist.  Eyes: EOM are normal. Pupils are equal, round, and reactive to light.  Neck: Normal range of motion. Neck supple. No thyromegaly present.  Cardiovascular: Normal rate, regular rhythm, normal heart sounds and intact distal pulses.  Exam reveals no gallop and no friction rub.   No murmur heard. Pulmonary/Chest: Effort normal and breath sounds normal.  Abdominal: Soft. Bowel sounds are normal. She exhibits no distension and no mass. There is no tenderness. There is no rebound.  Genitourinary: Guaiac negative stool.       Pelvic exam in the fall normal she's had a hysterectomy therefore pelvic exams and Paps are no longer indicated.  Bilateral breast exam normal except for a cystic lesion left breast 9:00 in the incision just adjacent to the areola it's pea size soft and rubbery movable.  Musculoskeletal: Normal range of motion.  Lymphadenopathy:    She has no cervical adenopathy.  Neurological: She is alert. She has normal reflexes. No cranial nerve deficit. She exhibits normal muscle tone. Coordination normal.  Skin: Skin is warm and dry.       Total body skin exam normal  Psychiatric: She has a normal mood and affect. Her behavior is normal. Judgment and thought content normal.          Assessment & Plan:  Healthy female  Elevated blood sugar continue metformin 1000 mg twice a hyperlipidemia continue Zocor and aspirin  Allergic rhinitis continue Singulair  Hypertension continue Hyzaar  Occasional anxiety Klonopin 1 mg each bedtime when necessary  PCO S. currently asymptomatic she's had a TAH and BSO  I think a lot of her other symptoms are related to deconditioning.day begin diet and exercise program followup A1c in 3 months

## 2012-04-24 ENCOUNTER — Ambulatory Visit (INDEPENDENT_AMBULATORY_CARE_PROVIDER_SITE_OTHER): Payer: BC Managed Care – PPO | Admitting: Family Medicine

## 2012-04-24 ENCOUNTER — Encounter: Payer: Self-pay | Admitting: Family Medicine

## 2012-04-24 VITALS — BP 122/82 | HR 110 | Temp 98.5°F | Wt 180.0 lb

## 2012-04-24 DIAGNOSIS — J069 Acute upper respiratory infection, unspecified: Secondary | ICD-10-CM

## 2012-04-24 MED ORDER — HYDROCODONE-HOMATROPINE 5-1.5 MG/5ML PO SYRP: 5.0000 mL | ORAL_SOLUTION | Freq: Every evening | ORAL | Status: DC | PRN

## 2012-04-24 NOTE — Patient Instructions (Addendum)

## 2012-04-24 NOTE — Progress Notes (Signed)
Chief Complaint  Patient presents with  . Sinusitis  . Cough    HPI:  Acute visit for Cough: -started: yesterday -symptoms:nasal congestion, sinuses dripping, sore throat, cough -denies:fever, SOB, NVD, tooth pain -has tried: delsum -sick contacts: yes -Hx of: hx of allergies, no hx of asthma -    ROS: See pertinent positives and negatives per HPI.  Past Medical History  Diagnosis Date  . POLYCYSTIC OVARIAN DISEASE 09/15/2007  . HYPERLIPIDEMIA 09/15/2007  . Essential hypertension, benign 01/26/2007  . ALLERGIC RHINITIS 09/15/2007  . Insomnia   . DM (diabetes mellitus)   . Obesity   . Rosacea   . Endometriosis   . Fibroid   . PCOS (polycystic ovarian syndrome)     Family History  Problem Relation Age of Onset  . Hypertension Mother   . Diabetes Mother   . Colon polyps Father   . Hypertension Father   . Heart disease Father   . Heart disease Maternal Grandmother   . Diabetes Sister     gestational  . Hypertension Sister   . Hypertension Brother   . Heart disease Maternal Grandfather   . Cancer Paternal Grandmother     Kidney cancer  . Kidney cancer Paternal Grandmother     History   Social History  . Marital Status: Divorced    Spouse Name: N/A    Number of Children: 3  . Years of Education: N/A   Occupational History  .     Social History Main Topics  . Smoking status: Former Smoker -- 0.2 packs/day    Quit date: 09/02/2010  . Smokeless tobacco: Never Used  . Alcohol Use: 4.8 oz/week    8 Glasses of wine per week     Comment: ocassionally  . Drug Use: No  . Sexually Active: Yes    Birth Control/ Protection: Surgical   Other Topics Concern  . None   Social History Narrative  . None    Current outpatient prescriptions:aspirin 81 MG tablet, Take 81 mg by mouth daily.  , Disp: , Rfl: ;  Azelaic Acid (FINACEA) 15 % cream, After skin is thoroughly washed and patted dry, gently but thoroughly massage a thin film of azelaic acid cream into the  affected area twice daily, in the morning and evening., Disp: 30 g, Rfl: 5 clonazePAM (KLONOPIN) 1 MG tablet, Take 1 tablet (1 mg total) by mouth daily as needed for anxiety., Disp: 90 tablet, Rfl: 0;  fluconazole (DIFLUCAN) 150 MG tablet, Take one tablet every other day., Disp: 3 tablet, Rfl: 6;  losartan-hydrochlorothiazide (HYZAAR) 100-25 MG per tablet, Take 1 tablet by mouth 2 (two) times daily., Disp: 180 tablet, Rfl: 3;  metFORMIN (GLUCOPHAGE XR) 500 MG 24 hr tablet, 2 tabs twice daily, Disp: 400 tablet, Rfl: 3 montelukast (SINGULAIR) 10 MG tablet, 1 daily, Disp: 100 tablet, Rfl: 3;  simvastatin (ZOCOR) 20 MG tablet, Take 1 tablet (20 mg total) by mouth at bedtime., Disp: 100 tablet, Rfl: 3;  Sulfacetamide Sodium-Sulfur (PLEXION CLEANSER) 10-5 % EMUL, Apply small amounts once daily, Disp: 227 g, Rfl: 4;  terconazole (TERAZOL 3) 0.8 % vaginal cream, Place 1 applicator vaginally at bedtime., Disp: 20 g, Rfl: 6 HYDROcodone-homatropine (HYCODAN) 5-1.5 MG/5ML syrup, Take 5 mLs by mouth at bedtime and may repeat dose one time if needed., Disp: 120 mL, Rfl: 0  EXAM:  Filed Vitals:   04/24/12 1350  BP: 122/82  Pulse: 110  Temp: 98.5 F (36.9 C)    There is no height on  file to calculate BMI.  GENERAL: vitals reviewed and listed above, alert, oriented, appears well hydrated and in no acute distress  HEENT: atraumatic, conjunttiva clear, no obvious abnormalities on inspection of external nose and ears, normal appearance of ear canals and TMs, clear nasal congestion, mild post oropharyngeal erythema with PND, no tonsillar edema or exudate, no sinus TTP  NECK: no obvious masses on inspection  LUNGS: clear to auscultation bilaterally, no wheezes, rales or rhonchi, good air movement  CV: HRRR, no peripheral edema  MS: moves all extremities without noticeable abnormality  PSYCH: pleasant and cooperative, no obvious depression or anxiety  ASSESSMENT AND PLAN:  Discussed the following  assessment and plan:  1. Upper respiratory infection  HYDROcodone-homatropine (HYCODAN) 5-1.5 MG/5ML syrup   -hx and exam c/w VUIR. Supportive care and return precautions advised. Cough medication provided - discussed risks/pt reports has taken before and is NOT allergic to codeine. -Patient advised to return or notify a doctor immediately if symptoms worsen or persist or new concerns arise.  Patient Instructions  INSTRUCTIONS FOR UPPER RESPIRATORY INFECTION:  -plenty of rest and fluids  -nasal saline wash 2-3 times daily (use prepackaged nasal saline or bottled/distilled water if making your own)   -can use sinex nasal spray for drainage and nasal congestion - but do NOT use longer then 3-4 days  -can use tylenol or ibuprofen as directed for aches and sorethroat  -in the winter time, using a humidifier at night is helpful (please follow cleaning instructions)  -if you are taking a cough medication - use only as directed, may also try a teaspoon of honey to coat the throat and throat lozenges  -for sore throat, salt water gargles can help  -follow up if you have fevers, facial pain, tooth pain, difficulty breathing or are worsening or not getting better in 5-7 days      KIM, HANNAH R.

## 2012-06-02 ENCOUNTER — Encounter: Payer: Self-pay | Admitting: Family Medicine

## 2012-06-02 ENCOUNTER — Ambulatory Visit (INDEPENDENT_AMBULATORY_CARE_PROVIDER_SITE_OTHER): Payer: BC Managed Care – PPO | Admitting: Family Medicine

## 2012-06-02 VITALS — BP 110/80 | Temp 98.2°F | Wt 170.0 lb

## 2012-06-02 DIAGNOSIS — J398 Other specified diseases of upper respiratory tract: Secondary | ICD-10-CM

## 2012-06-02 DIAGNOSIS — J399 Disease of upper respiratory tract, unspecified: Secondary | ICD-10-CM

## 2012-06-02 NOTE — Patient Instructions (Addendum)

## 2012-06-02 NOTE — Progress Notes (Signed)
Chief Complaint  Patient presents with  . Sinusitis  . head congestion    HPI:  Acute visit for ? Sinus infection: -started: 4 days -symptoms:nasal congestion, sore throat, cough, sinus congestion - sinus pain maxillary -denies:fever, SOB, NVD, tooth pain -has tried: Singulair, allegra, benadryl, alkaseltzer -sick contacts: ex-husband with a cold and son sick   ROS: See pertinent positives and negatives per HPI.  Past Medical History  Diagnosis Date  . POLYCYSTIC OVARIAN DISEASE 09/15/2007  . HYPERLIPIDEMIA 09/15/2007  . Essential hypertension, benign 01/26/2007  . ALLERGIC RHINITIS 09/15/2007  . Insomnia   . DM (diabetes mellitus)   . Obesity   . Rosacea   . Endometriosis   . Fibroid   . PCOS (polycystic ovarian syndrome)     Family History  Problem Relation Age of Onset  . Hypertension Mother   . Diabetes Mother   . Colon polyps Father   . Hypertension Father   . Heart disease Father   . Heart disease Maternal Grandmother   . Diabetes Sister     gestational  . Hypertension Sister   . Hypertension Brother   . Heart disease Maternal Grandfather   . Cancer Paternal Grandmother     Kidney cancer  . Kidney cancer Paternal Grandmother     History   Social History  . Marital Status: Divorced    Spouse Name: N/A    Number of Children: 3  . Years of Education: N/A   Occupational History  .     Social History Main Topics  . Smoking status: Former Smoker -- 0.25 packs/day    Quit date: 09/02/2010  . Smokeless tobacco: Never Used  . Alcohol Use: 4.8 oz/week    8 Glasses of wine per week     Comment: ocassionally  . Drug Use: No  . Sexually Active: Yes    Birth Control/ Protection: Surgical   Other Topics Concern  . None   Social History Narrative  . None    Current outpatient prescriptions:aspirin 81 MG tablet, Take 81 mg by mouth daily.  , Disp: , Rfl: ;  Azelaic Acid (FINACEA) 15 % cream, After skin is thoroughly washed and patted dry, gently but  thoroughly massage a thin film of azelaic acid cream into the affected area twice daily, in the morning and evening., Disp: 30 g, Rfl: 5 clonazePAM (KLONOPIN) 1 MG tablet, Take 1 tablet (1 mg total) by mouth daily as needed for anxiety., Disp: 90 tablet, Rfl: 0;  losartan-hydrochlorothiazide (HYZAAR) 100-25 MG per tablet, Take 1 tablet by mouth 2 (two) times daily., Disp: 180 tablet, Rfl: 3;  metFORMIN (GLUCOPHAGE XR) 500 MG 24 hr tablet, 2 tabs twice daily, Disp: 400 tablet, Rfl: 3;  montelukast (SINGULAIR) 10 MG tablet, 1 daily, Disp: 100 tablet, Rfl: 3 simvastatin (ZOCOR) 20 MG tablet, Take 1 tablet (20 mg total) by mouth at bedtime., Disp: 100 tablet, Rfl: 3;  Sulfacetamide Sodium-Sulfur (PLEXION CLEANSER) 10-5 % EMUL, Apply small amounts once daily, Disp: 227 g, Rfl: 4;  terconazole (TERAZOL 3) 0.8 % vaginal cream, Place 1 applicator vaginally at bedtime., Disp: 20 g, Rfl: 6  EXAM:  Filed Vitals:   06/02/12 1414  BP: 110/80  Temp: 98.2 F (36.8 C)    Body mass index is 31.85 kg/(m^2).  GENERAL: vitals reviewed and listed above, alert, oriented, appears well hydrated and in no acute distress  HEENT: atraumatic, conjunttiva clear, no obvious abnormalities on inspection of external nose and ears, normal appearance of ear canals and  TMs, clear nasal congestion, mild post oropharyngeal erythema with PND, no tonsillar edema or exudate, no sinus TTP  NECK: no obvious masses on inspection  LUNGS: clear to auscultation bilaterally, no wheezes, rales or rhonchi, good air movement  CV: HRRR, no peripheral edema  MS: moves all extremities without noticeable abnormality  PSYCH: pleasant and cooperative, no obvious depression or anxiety  ASSESSMENT AND PLAN:  Discussed the following assessment and plan:  Upper respiratory disease  -likely viral - discussed supportive care, nasal saline, humidifier - if worsening or not better in 7 days she will call for abx (if needs abx will need diflucan  2 tabs) -Patient advised to return or notify a doctor immediately if symptoms worsen or persist or new concerns arise.  Patient Instructions  INSTRUCTIONS FOR UPPER RESPIRATORY INFECTION:  -plenty of rest and fluids  -nasal saline wash 2-3 times daily (use prepackaged nasal saline or bottled/distilled water if making your own)   -can use sinex nasal spray for drainage and nasal congestion - but do NOT use longer then 3-4 days  -can use tylenol or ibuprofen as directed for aches and sorethroat  -in the winter time, using a humidifier at night is helpful (please follow cleaning instructions)  -if you are taking a cough medication - use only as directed, may also try a teaspoon of honey to coat the throat and throat lozenges  -for sore throat, salt water gargles can help  -follow up if you have fevers, facial pain, tooth pain, difficulty breathing or are worsening or not getting better in 5-7 days      Monica Cunningham, Monica R.

## 2012-06-12 ENCOUNTER — Telehealth: Payer: Self-pay | Admitting: Family Medicine

## 2012-06-12 MED ORDER — FLUCONAZOLE 150 MG PO TABS: 150.0000 mg | ORAL_TABLET | Freq: Once | ORAL | Status: DC

## 2012-06-12 MED ORDER — AZITHROMYCIN 250 MG PO TABS: ORAL_TABLET | ORAL | Status: DC

## 2012-06-12 NOTE — Telephone Encounter (Signed)
Tried to call patient but the "mailbox is full"

## 2012-06-12 NOTE — Telephone Encounter (Signed)
Patient Information:  Caller Name: Beautiful  Phone: [phone]  Patient: Monica Cunningham, Monica Cunningham  Gender: Female  DOB: 31-Jul-1960  Age: 52 Years  PCP: Kelle Darting [facility])  Pregnant: No  Office Follow Up:  Does the office need to follow up with this patient?: Yes  Instructions For The Office: Requesting Z-pack and Diclucan X2-3 for sinus infection that MD documented at last office visit 06/02/12.  RN Note:  Hysterectomy;  Called for Z-pack and 2-3 Diflucan that MD said would be prescribed if sinus symptoms continued. Face feels warm with  red cheeks, facial pressure, frontal headache and purulent nasal discharge. Temp 99.2 po at 1345.  Fasting blood sugr 145. Declined to make appointment since MD said she would order antibiotic if symptoms continue. She is an acccountant and cannot come in easily.  [facility] [address].  Please call Ayanni back to confirm RX has been ordered.  Symptoms  Reason For Call & Symptoms: Called for antibiotic and Diflucan since symptoms of sinus infection are worsening.  Cheeks feel hot and sore. Purulent nasal drainage and ongoing productive cough  Reviewed Health History In EMR: Yes  Reviewed Medications In EMR: Yes  Reviewed Allergies In EMR: Yes  Reviewed Surgeries / Procedures: Yes  Date of Onset of Symptoms: 05/30/2012  Treatments Tried: Netti Pot, antihistamines, Ibuprofen, humidifier, showers  Treatments Tried Worked: No OB / GYN:  LMP: Unknown  Guideline(s) Used:  Sinus Pain and Congestion  Disposition Per Guideline:   Go to Office Now  Reason For Disposition Reached:   Redness or swelling on the cheek, forehead, or around the eye  Advice Given:  N/A  RN Overrode Recommendation:  Patient Requests Prescription  MD said would order antibiotic and Diflucan, if symptoms continued.

## 2012-06-12 NOTE — Telephone Encounter (Signed)
Sent requests to pharmacy. Must see a doctor if symptoms do not resolve with treatment or if she is very sick.

## 2012-08-18 ENCOUNTER — Other Ambulatory Visit: Payer: Self-pay | Admitting: Family Medicine

## 2012-08-18 DIAGNOSIS — I10 Essential (primary) hypertension: Secondary | ICD-10-CM

## 2012-08-18 DIAGNOSIS — E785 Hyperlipidemia, unspecified: Secondary | ICD-10-CM

## 2012-08-18 DIAGNOSIS — E1151 Type 2 diabetes mellitus with diabetic peripheral angiopathy without gangrene: Secondary | ICD-10-CM

## 2012-08-18 MED ORDER — FREESTYLE SYSTEM KIT: PACK | Status: DC

## 2012-08-19 ENCOUNTER — Other Ambulatory Visit: Payer: Self-pay | Admitting: *Deleted

## 2012-08-19 MED ORDER — GLUCOSE BLOOD VI STRP: 1.0000 | ORAL_STRIP | Freq: Every day | Status: DC

## 2012-08-19 MED ORDER — GLUCOSE BLOOD VI STRP: ORAL_STRIP | Status: DC

## 2012-09-01 ENCOUNTER — Other Ambulatory Visit (INDEPENDENT_AMBULATORY_CARE_PROVIDER_SITE_OTHER): Payer: BC Managed Care – PPO

## 2012-09-01 DIAGNOSIS — I70209 Unspecified atherosclerosis of native arteries of extremities, unspecified extremity: Secondary | ICD-10-CM

## 2012-09-01 DIAGNOSIS — I10 Essential (primary) hypertension: Secondary | ICD-10-CM

## 2012-09-01 DIAGNOSIS — E1159 Type 2 diabetes mellitus with other circulatory complications: Secondary | ICD-10-CM

## 2012-09-01 DIAGNOSIS — E785 Hyperlipidemia, unspecified: Secondary | ICD-10-CM

## 2012-09-01 DIAGNOSIS — E1151 Type 2 diabetes mellitus with diabetic peripheral angiopathy without gangrene: Secondary | ICD-10-CM

## 2012-09-01 LAB — LIPID PANEL
LDL Cholesterol: 90 mg/dL (ref 0–99)
VLDL: 31.8 mg/dL (ref 0.0–40.0)

## 2012-09-01 LAB — BASIC METABOLIC PANEL
Calcium: 9.9 mg/dL (ref 8.4–10.5)
GFR: 57.82 mL/min — ABNORMAL LOW (ref >60.00)
Glucose, Bld: 121 mg/dL — ABNORMAL HIGH (ref 70–99)
Sodium: 140 mEq/L (ref 135–145)

## 2012-09-01 LAB — MICROALBUMIN / CREATININE URINE RATIO
Microalb Creat Ratio: 2.6 mg/g (ref 0.0–30.0)
Microalb, Ur: 4.2 mg/dL — ABNORMAL HIGH (ref 0.0–1.9)

## 2012-09-08 ENCOUNTER — Encounter: Payer: Self-pay | Admitting: Family Medicine

## 2012-09-08 ENCOUNTER — Ambulatory Visit (INDEPENDENT_AMBULATORY_CARE_PROVIDER_SITE_OTHER): Payer: BC Managed Care – PPO | Admitting: Family Medicine

## 2012-09-08 VITALS — BP 120/80 | Temp 99.2°F | Wt 172.0 lb

## 2012-09-08 DIAGNOSIS — E785 Hyperlipidemia, unspecified: Secondary | ICD-10-CM

## 2012-09-08 DIAGNOSIS — E1165 Type 2 diabetes mellitus with hyperglycemia: Secondary | ICD-10-CM | POA: Insufficient documentation

## 2012-09-08 DIAGNOSIS — L989 Disorder of the skin and subcutaneous tissue, unspecified: Secondary | ICD-10-CM

## 2012-09-08 DIAGNOSIS — E119 Type 2 diabetes mellitus without complications: Secondary | ICD-10-CM

## 2012-09-08 DIAGNOSIS — I10 Essential (primary) hypertension: Secondary | ICD-10-CM

## 2012-09-08 MED ORDER — SIMVASTATIN 40 MG PO TABS: 40.0000 mg | ORAL_TABLET | Freq: Every day | ORAL | Status: DC

## 2012-09-08 NOTE — Patient Instructions (Signed)
Continue your good health habits diet and exercise  Continue to monitor your blood sugar 3 times weekly  Followup in December  Nonfasting labs one week prior  Increase the Zocor to 60 mg daily  We will also do a lipid panel in December  I will call you within 2 weeks with a report on the lesion we removed from your scalp

## 2012-09-08 NOTE — Addendum Note (Signed)
Addended by: Kern Reap B on: 09/08/2012 12:21 PM   Modules accepted: Orders

## 2012-09-08 NOTE — Progress Notes (Signed)
  Subjective:    Patient ID: Monica Cunningham, female    DOB: 1960/12/17, 52 y.o.   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  Her fasting blood sugar is 121 and her A1c is down from 7.2-6.7. Also her microalbumin has dropped from 14-4 with better control of her blood sugar. She's also started a diet and exercise program and has lost 10 pounds.  She thinks she might have an incisional hernia  And she does have light skin and light eyes  She has a nonhealing lesion left upper scalp which we will remove today   Review of Systems    review of systems otherwise negative except she's done a much better job with her diet and exercise and indeed has lost 10 pounds Objective:   Physical Exam Well-developed well nourished female no acute distress vital signs stable BP 120/80 skin exam normal except for nonhealing lesion left upper scalp,,,,,,, which will be removed today  Her lipids are much improved except that her LDL should be below 75 it's right at 90. Therefore we'll increase her Zocor to 40 mg daily  She was taken to the treatment room after informed consent the lesions in her left upper scalp which measures 6 mm x 6 mm was anesthetized with 1% Xylocaine with epinephrine. It was excised with 2 mm margins. The lesion was sent for pathologic analysis. The base was cauterized she tolerated the procedure no complicationsclinically it appears to be a dysplastic nevus       Assessment & Plan:  Diabetes type 2 at goal followup in 6 months continue current therapy  Hypertension at goal continue current therapy  Hyperlipidemia almost at goal increase Zocor to 40 mg daily  Abnormal lesion left upper scalp at the hairline removed as outlined  Probable dysplastic nevus left upper scalp rule out skin cancer path pending

## 2012-11-07 ENCOUNTER — Other Ambulatory Visit: Payer: Self-pay | Admitting: *Deleted

## 2012-11-07 ENCOUNTER — Other Ambulatory Visit: Payer: Self-pay | Admitting: Family Medicine

## 2012-11-07 DIAGNOSIS — E282 Polycystic ovarian syndrome: Secondary | ICD-10-CM

## 2012-11-07 MED ORDER — CLONAZEPAM 1 MG PO TABS: 1.0000 mg | ORAL_TABLET | Freq: Every day | ORAL | Status: DC | PRN

## 2013-01-30 ENCOUNTER — Encounter: Payer: Self-pay | Admitting: Family Medicine

## 2013-01-30 ENCOUNTER — Ambulatory Visit (INDEPENDENT_AMBULATORY_CARE_PROVIDER_SITE_OTHER): Payer: BC Managed Care – PPO | Admitting: Family Medicine

## 2013-01-30 VITALS — BP 120/80 | HR 90 | Temp 98.6°F | Wt 176.0 lb

## 2013-01-30 DIAGNOSIS — J019 Acute sinusitis, unspecified: Secondary | ICD-10-CM

## 2013-01-30 MED ORDER — HYDROCODONE-HOMATROPINE 5-1.5 MG/5ML PO SYRP: 5.0000 mL | ORAL_SOLUTION | ORAL | Status: DC | PRN

## 2013-01-30 MED ORDER — AZITHROMYCIN 250 MG PO TABS: ORAL_TABLET | ORAL | Status: DC

## 2013-01-30 NOTE — Progress Notes (Signed)
  Subjective:    Patient ID: Monica Cunningham, female    DOB: [birth year], 52 y.o.   MRN: [id number]  HPI Here for 2 weeks of PND, sinus pressure, and coughing up yellow sputum. No fever.    Review of Systems  Constitutional: Negative.   HENT: Positive for congestion, postnasal drip and sinus pressure.   Eyes: Negative.   Respiratory: Positive for cough.        Objective:   Physical Exam  Constitutional: She appears well-developed and well-nourished.  HENT:  Right Ear: External ear normal.  Left Ear: External ear normal.  Nose: Nose normal.  Mouth/Throat: Oropharynx is clear and moist.  Eyes: Conjunctivae are normal.  Pulmonary/Chest: Effort normal and breath sounds normal.  Lymphadenopathy:    She has no cervical adenopathy.          Assessment & Plan:  Drink fluids.

## 2013-02-05 ENCOUNTER — Other Ambulatory Visit: Payer: Self-pay

## 2013-02-13 ENCOUNTER — Other Ambulatory Visit: Payer: Self-pay

## 2013-02-13 ENCOUNTER — Telehealth: Payer: Self-pay | Admitting: Family Medicine

## 2013-02-13 DIAGNOSIS — Z13820 Encounter for screening for osteoporosis: Secondary | ICD-10-CM

## 2013-02-13 DIAGNOSIS — Z1231 Encounter for screening mammogram for malignant neoplasm of breast: Secondary | ICD-10-CM

## 2013-02-13 MED ORDER — GLUCOSE BLOOD VI STRP: ORAL_STRIP | Status: DC

## 2013-02-13 NOTE — Telephone Encounter (Signed)
Pt needs refill on bayer contour next glucose blood test strips. Pt test at least 3 times a day if possible. [facility] [address]. Pt needs order for bmd at [facility] [place]. Pt will make her own appt. Please call pt when the order has been sent.

## 2013-02-13 NOTE — Telephone Encounter (Signed)
Rx sent and order placed

## 2013-03-13 ENCOUNTER — Other Ambulatory Visit: Payer: Self-pay | Admitting: Family Medicine

## 2013-03-13 DIAGNOSIS — Z13820 Encounter for screening for osteoporosis: Secondary | ICD-10-CM

## 2013-03-13 DIAGNOSIS — N6320 Unspecified lump in the left breast, unspecified quadrant: Secondary | ICD-10-CM

## 2013-03-13 DIAGNOSIS — E119 Type 2 diabetes mellitus without complications: Secondary | ICD-10-CM

## 2013-03-13 DIAGNOSIS — Z87898 Personal history of other specified conditions: Secondary | ICD-10-CM

## 2013-03-17 ENCOUNTER — Other Ambulatory Visit: Payer: BC Managed Care – PPO

## 2013-03-18 ENCOUNTER — Other Ambulatory Visit: Payer: Self-pay | Admitting: Family Medicine

## 2013-03-18 DIAGNOSIS — N6320 Unspecified lump in the left breast, unspecified quadrant: Secondary | ICD-10-CM

## 2013-03-23 ENCOUNTER — Other Ambulatory Visit: Payer: Self-pay | Admitting: Family Medicine

## 2013-03-23 DIAGNOSIS — M8580 Other specified disorders of bone density and structure, unspecified site: Secondary | ICD-10-CM

## 2013-03-30 ENCOUNTER — Ambulatory Visit: Payer: BC Managed Care – PPO | Admitting: Family Medicine

## 2013-03-30 ENCOUNTER — Other Ambulatory Visit (INDEPENDENT_AMBULATORY_CARE_PROVIDER_SITE_OTHER): Payer: BC Managed Care – PPO

## 2013-03-30 DIAGNOSIS — E119 Type 2 diabetes mellitus without complications: Secondary | ICD-10-CM

## 2013-03-30 DIAGNOSIS — E785 Hyperlipidemia, unspecified: Secondary | ICD-10-CM

## 2013-03-30 LAB — MICROALBUMIN / CREATININE URINE RATIO
Creatinine,U: 283.9 mg/dL
Microalb Creat Ratio: 2.5 mg/g (ref 0.0–30.0)
Microalb, Ur: 7 mg/dL — ABNORMAL HIGH (ref 0.0–1.9)

## 2013-03-30 LAB — LIPID PANEL
Cholesterol: 181 mg/dL (ref 0–200)
LDL Cholesterol: 99 mg/dL (ref 0–99)
Total CHOL/HDL Ratio: 4
Triglycerides: 195 mg/dL — ABNORMAL HIGH (ref 0.0–149.0)
VLDL: 39 mg/dL (ref 0.0–40.0)

## 2013-03-30 LAB — BASIC METABOLIC PANEL
BUN: 20 mg/dL (ref 6–23)
CO2: 27 mEq/L (ref 19–32)
Creatinine, Ser: 0.9 mg/dL (ref 0.4–1.2)
GFR: 66.98 mL/min (ref >60.00)
Potassium: 3.8 mEq/L (ref 3.5–5.1)

## 2013-04-07 ENCOUNTER — Encounter: Payer: Self-pay | Admitting: Family Medicine

## 2013-04-07 ENCOUNTER — Ambulatory Visit (INDEPENDENT_AMBULATORY_CARE_PROVIDER_SITE_OTHER): Payer: BC Managed Care – PPO | Admitting: Family Medicine

## 2013-04-07 VITALS — BP 120/80 | Temp 98.4°F | Wt 181.0 lb

## 2013-04-07 DIAGNOSIS — Z23 Encounter for immunization: Secondary | ICD-10-CM

## 2013-04-07 DIAGNOSIS — J309 Allergic rhinitis, unspecified: Secondary | ICD-10-CM

## 2013-04-07 DIAGNOSIS — I10 Essential (primary) hypertension: Secondary | ICD-10-CM

## 2013-04-07 DIAGNOSIS — E119 Type 2 diabetes mellitus without complications: Secondary | ICD-10-CM

## 2013-04-07 DIAGNOSIS — E282 Polycystic ovarian syndrome: Secondary | ICD-10-CM

## 2013-04-07 MED ORDER — CLONAZEPAM 1 MG PO TABS: 1.0000 mg | ORAL_TABLET | Freq: Every day | ORAL | Status: DC | PRN

## 2013-04-07 MED ORDER — MONTELUKAST SODIUM 10 MG PO TABS: ORAL_TABLET | ORAL | Status: DC

## 2013-04-07 MED ORDER — SIMVASTATIN 40 MG PO TABS: 40.0000 mg | ORAL_TABLET | Freq: Every day | ORAL | Status: DC

## 2013-04-07 MED ORDER — METFORMIN HCL 1000 MG PO TABS: ORAL_TABLET | ORAL | Status: DC

## 2013-04-07 MED ORDER — GLIPIZIDE 5 MG PO TABS: 5.0000 mg | ORAL_TABLET | Freq: Two times a day (BID) | ORAL | Status: DC

## 2013-04-07 MED ORDER — GLUCOSE BLOOD VI STRP: ORAL_STRIP | Status: DC

## 2013-04-07 NOTE — Progress Notes (Signed)
   Subjective:    Patient ID: Monica Cunningham, female    DOB: Jan 31, 1961, 53 y.o.   MRN: [id number]  HPI Monica Cunningham is a 53 year old female who comes in today for followup of diabetes  She's on metformin 1000 mg twice a day blood sugars up in the 156 range A1c 7.9% also 7.0 mg/dL microbial and.  Over the holidays she's not been following her diet nor exercising like she usually does  She has a cyst on her wrist referred to the hand surgeon when necessary  She says at times it feels like her heart skips but she's not checking her pulse she has no lightheadedness or shortness of breath etc.  She has some chest wall pain the comes and goes and is behind her left nipple. She's due for a mammogram. She's also having difficulty with sleeping because of reflux esophagitis.   Review of Systems    review of systems otherwise negative Objective:   Physical Exam  Well-developed well nourished female no acute distress vital signs stable she's afebrile BP 120/80      Assessment & Plan:  Diabetes type 2 not at goal add glipizide 5 mg in the morning,,,,,, diet exercise program,,,,,,,,, followup in 3 months  Hypertension at goal continue current therapy  Cystic lesion right wrist referred to hand surgeon  Chest wall pain reassured  Reflux esophagitis treatment program outlined

## 2013-04-07 NOTE — Patient Instructions (Signed)
Metformin 1000 mg,,,,,,, one tablet twice daily  Glipizide 5 mg,,,,,,,, one tablet daily in the morning for 4 weeks if at that juncture your blood sugar normalizes,,,,,,,, 120 fasting in the morning,,,,,,,, continue 5 mg. If it's still high double the dose and take 5 mg twice daily  Restart your walking and exercise program  Followup in 3 months  Labs one week prior  For the reflux I would have nothing to drink for 2-3 hours prior bedtime. Sleep on 2 pillows and take Prilosec 20 mg daily in the morning for 6 weeks

## 2013-04-08 ENCOUNTER — Telehealth: Payer: Self-pay

## 2013-04-08 NOTE — Telephone Encounter (Signed)
Relevant patient education assigned to patient using Emmi. ° °

## 2013-05-05 ENCOUNTER — Telehealth: Payer: Self-pay | Admitting: Family Medicine

## 2013-05-05 NOTE — Telephone Encounter (Signed)
Relevant patient education assigned to patient using Emmi. ° °

## 2013-06-27 ENCOUNTER — Other Ambulatory Visit: Payer: Self-pay | Admitting: Family Medicine

## 2013-07-02 ENCOUNTER — Telehealth: Payer: Self-pay | Admitting: Family Medicine

## 2013-07-02 DIAGNOSIS — E119 Type 2 diabetes mellitus without complications: Secondary | ICD-10-CM

## 2013-07-02 MED ORDER — GLUCOSE BLOOD VI STRP: ORAL_STRIP | Status: DC

## 2013-07-02 NOTE — Telephone Encounter (Signed)
Pt needs refill on bayer contour test strips #100 call into [facility]

## 2013-07-13 ENCOUNTER — Other Ambulatory Visit: Payer: BC Managed Care – PPO

## 2013-07-20 ENCOUNTER — Ambulatory Visit: Payer: BC Managed Care – PPO | Admitting: Family Medicine

## 2013-08-13 ENCOUNTER — Encounter: Payer: Self-pay | Admitting: Family Medicine

## 2013-08-13 ENCOUNTER — Ambulatory Visit (INDEPENDENT_AMBULATORY_CARE_PROVIDER_SITE_OTHER): Payer: BC Managed Care – PPO | Admitting: Family Medicine

## 2013-08-13 VITALS — BP 130/90 | Temp 98.7°F | Wt 186.0 lb

## 2013-08-13 DIAGNOSIS — L0291 Cutaneous abscess, unspecified: Secondary | ICD-10-CM

## 2013-08-13 DIAGNOSIS — L0390 Cellulitis, unspecified: Secondary | ICD-10-CM | POA: Insufficient documentation

## 2013-08-13 MED ORDER — CEPHALEXIN 500 MG PO CAPS: ORAL_CAPSULE | ORAL | Status: DC

## 2013-08-13 NOTE — Progress Notes (Signed)
   Subjective:    Patient ID: Monica Cunningham, female    DOB: [birth year], 53 y.o.   MRN: [id number]  HPI Monica Cunningham is a 53 year old female comes in today for evaluation of redness and soreness of her left breast since last night  Last night she noticed some soreness and looked in patient noticed diffuse redness of her left breast. No history of trauma. She has had a reduction mammoplasty in the past   Review of Systems    review of systems negative Objective:   Physical Exam Well-developed well nourished female no acute distress vital signs stable examination of the breast shows a superficial cellulitis left breast       Assessment & Plan:  Superficial cellulitis,,,,,,,,,,,, warm soaks,,,,,, Keflex,

## 2013-08-13 NOTE — Patient Instructions (Signed)
Keflex 500 mg......... 2 tabs twice daily  Warm soaks or warm soaks 4 times daily.......... Or heating pad low heat  Return when necessary

## 2013-08-13 NOTE — Progress Notes (Signed)
Pre visit review using our clinic review tool, if applicable. No additional management support is needed unless otherwise documented below in the visit note. 

## 2013-08-18 ENCOUNTER — Other Ambulatory Visit (INDEPENDENT_AMBULATORY_CARE_PROVIDER_SITE_OTHER): Payer: BC Managed Care – PPO

## 2013-08-18 DIAGNOSIS — Z0000 Encounter for general adult medical examination without abnormal findings: Secondary | ICD-10-CM

## 2013-08-18 LAB — BASIC METABOLIC PANEL
BUN: 16 mg/dL (ref 6–23)
CHLORIDE: 103 meq/L (ref 96–112)
CO2: 25 mEq/L (ref 19–32)
CREATININE: 1 mg/dL (ref 0.4–1.2)
Calcium: 9.4 mg/dL (ref 8.4–10.5)
GFR: 65.27 mL/min (ref >60.00)
Glucose, Bld: 127 mg/dL — ABNORMAL HIGH (ref 70–99)
Potassium: 3.7 mEq/L (ref 3.5–5.1)
SODIUM: 137 meq/L (ref 135–145)

## 2013-08-18 LAB — HEMOGLOBIN A1C: Hgb A1c MFr Bld: 7.3 % — ABNORMAL HIGH (ref 4.6–6.5)

## 2013-08-18 LAB — POCT URINALYSIS DIPSTICK
BILIRUBIN UA: NEGATIVE
GLUCOSE UA: NEGATIVE
KETONES UA: NEGATIVE
Leukocytes, UA: NEGATIVE
NITRITE UA: NEGATIVE
Protein, UA: NEGATIVE
RBC UA: NEGATIVE
SPEC GRAV UA: 1.025
Urobilinogen, UA: 0.2
pH, UA: 5

## 2013-08-18 LAB — MICROALBUMIN / CREATININE URINE RATIO
CREATININE, U: 186.5 mg/dL
Microalb Creat Ratio: 1.2 mg/g (ref 0.0–30.0)
Microalb, Ur: 2.2 mg/dL — ABNORMAL HIGH (ref 0.0–1.9)

## 2013-08-18 LAB — CBC WITH DIFFERENTIAL/PLATELET
BASOS PCT: 0.6 % (ref 0.0–3.0)
Basophils Absolute: 0.1 10*3/uL (ref 0.0–0.1)
Eosinophils Absolute: 0.3 10*3/uL (ref 0.0–0.7)
Eosinophils Relative: 3.3 % (ref 0.0–5.0)
HCT: 43.7 % (ref 36.0–46.0)
Hemoglobin: 14.7 g/dL (ref 12.0–15.0)
LYMPHS PCT: 33.6 % (ref 12.0–46.0)
Lymphs Abs: 2.7 10*3/uL (ref 0.7–4.0)
MCHC: 33.7 g/dL (ref 30.0–36.0)
MCV: 94.9 fl (ref 78.0–100.0)
Monocytes Absolute: 0.7 10*3/uL (ref 0.1–1.0)
Monocytes Relative: 8.3 % (ref 3.0–12.0)
NEUTROS PCT: 54.2 % (ref 43.0–77.0)
Neutro Abs: 4.3 10*3/uL (ref 1.4–7.7)
Platelets: 206 10*3/uL (ref 150.0–400.0)
RBC: 4.6 Mil/uL (ref 3.87–5.11)
RDW: 12.9 % (ref 11.5–15.5)
WBC: 7.9 10*3/uL (ref 4.0–10.5)

## 2013-08-18 LAB — LIPID PANEL
CHOL/HDL RATIO: 4
Cholesterol: 148 mg/dL (ref 0–200)
HDL: 37.9 mg/dL — ABNORMAL LOW (ref >39.00)
LDL Cholesterol: 89 mg/dL (ref 0–99)
Triglycerides: 104 mg/dL (ref 0.0–149.0)
VLDL: 20.8 mg/dL (ref 0.0–40.0)

## 2013-08-18 LAB — HEPATIC FUNCTION PANEL
ALK PHOS: 50 U/L (ref 39–117)
ALT: 80 U/L — AB (ref 0–35)
AST: 60 U/L — ABNORMAL HIGH (ref 0–37)
Albumin: 4.2 g/dL (ref 3.5–5.2)
BILIRUBIN DIRECT: 0.2 mg/dL (ref 0.0–0.3)
TOTAL PROTEIN: 7.1 g/dL (ref 6.0–8.3)
Total Bilirubin: 1.1 mg/dL (ref 0.2–1.2)

## 2013-08-18 LAB — TSH: TSH: 1.29 u[IU]/mL (ref 0.35–4.50)

## 2013-09-01 ENCOUNTER — Ambulatory Visit (INDEPENDENT_AMBULATORY_CARE_PROVIDER_SITE_OTHER): Payer: BC Managed Care – PPO | Admitting: Family Medicine

## 2013-09-01 ENCOUNTER — Encounter: Payer: Self-pay | Admitting: Family Medicine

## 2013-09-01 VITALS — BP 112/80 | Temp 98.8°F | Ht 61.5 in | Wt 186.0 lb

## 2013-09-01 DIAGNOSIS — N760 Acute vaginitis: Secondary | ICD-10-CM

## 2013-09-01 DIAGNOSIS — N951 Menopausal and female climacteric states: Secondary | ICD-10-CM

## 2013-09-01 DIAGNOSIS — M25559 Pain in unspecified hip: Secondary | ICD-10-CM

## 2013-09-01 DIAGNOSIS — E119 Type 2 diabetes mellitus without complications: Secondary | ICD-10-CM

## 2013-09-01 DIAGNOSIS — Z87448 Personal history of other diseases of urinary system: Secondary | ICD-10-CM

## 2013-09-01 DIAGNOSIS — Z23 Encounter for immunization: Secondary | ICD-10-CM

## 2013-09-01 DIAGNOSIS — M25551 Pain in right hip: Secondary | ICD-10-CM | POA: Insufficient documentation

## 2013-09-01 DIAGNOSIS — R3129 Other microscopic hematuria: Secondary | ICD-10-CM

## 2013-09-01 DIAGNOSIS — E785 Hyperlipidemia, unspecified: Secondary | ICD-10-CM

## 2013-09-01 DIAGNOSIS — J45909 Unspecified asthma, uncomplicated: Secondary | ICD-10-CM

## 2013-09-01 DIAGNOSIS — E282 Polycystic ovarian syndrome: Secondary | ICD-10-CM

## 2013-09-01 DIAGNOSIS — Z780 Asymptomatic menopausal state: Secondary | ICD-10-CM

## 2013-09-01 DIAGNOSIS — N809 Endometriosis, unspecified: Secondary | ICD-10-CM

## 2013-09-01 DIAGNOSIS — M25552 Pain in left hip: Secondary | ICD-10-CM

## 2013-09-01 DIAGNOSIS — J309 Allergic rhinitis, unspecified: Secondary | ICD-10-CM

## 2013-09-01 MED ORDER — ESTROGENS CONJUGATED 0.3 MG PO TABS: 0.3000 mg | ORAL_TABLET | Freq: Every day | ORAL | Status: DC

## 2013-09-01 MED ORDER — GLIPIZIDE 5 MG PO TABS: 5.0000 mg | ORAL_TABLET | Freq: Two times a day (BID) | ORAL | Status: DC

## 2013-09-01 MED ORDER — TERCONAZOLE 0.8 % VA CREA: 1.0000 | TOPICAL_CREAM | Freq: Every day | VAGINAL | Status: DC

## 2013-09-01 MED ORDER — METFORMIN HCL 1000 MG PO TABS: ORAL_TABLET | ORAL | Status: DC

## 2013-09-01 MED ORDER — CLONAZEPAM 1 MG PO TABS: 1.0000 mg | ORAL_TABLET | Freq: Every day | ORAL | Status: DC | PRN

## 2013-09-01 MED ORDER — FLUCONAZOLE 150 MG PO TABS: 150.0000 mg | ORAL_TABLET | Freq: Once | ORAL | Status: DC

## 2013-09-01 MED ORDER — MONTELUKAST SODIUM 10 MG PO TABS: ORAL_TABLET | ORAL | Status: DC

## 2013-09-01 MED ORDER — SIMVASTATIN 40 MG PO TABS: 40.0000 mg | ORAL_TABLET | Freq: Every day | ORAL | Status: DC

## 2013-09-01 MED ORDER — LOSARTAN POTASSIUM-HCTZ 100-25 MG PO TABS: ORAL_TABLET | ORAL | Status: DC

## 2013-09-01 NOTE — Progress Notes (Signed)
Subjective:    Patient ID: Monica Cunningham, female    DOB: 1960-11-27, 53 y.o.   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 She also has a itching sensation of her feet, a right inguinal hernia actually it's in the incision she had when she had her abdominal surgery, right and left hip pain for the past 6 months and not on the right side of her foot  She gets routine eye care, dental care, BSE monthly.........Marland Kitchen bilateral breast reduction in the past..... and you mammography due. At age 43 she is due for screening colonoscopy.  Vaccinations updated by Monica Cunningham   Review of Systems  Constitutional: Negative.   HENT: Negative.   Eyes: Negative.   Respiratory: Negative.   Cardiovascular: Negative.   Gastrointestinal: Negative.   Genitourinary: Negative.   Musculoskeletal: Negative.   Neurological: Negative.   Psychiatric/Behavioral: Negative.        Objective:   Physical Exam  Nursing note and vitals reviewed. Constitutional: She is oriented to person, place, and time. She appears well-developed and well-nourished.  HENT:  Head: Normocephalic and atraumatic.  Right Ear: External ear normal.  Left Ear: External ear normal.  Nose: Nose normal.  Mouth/Throat: Oropharynx is clear and moist.  Eyes: Conjunctivae and EOM are normal. Pupils are equal, round, and reactive to light. Right eye exhibits no discharge. Left eye exhibits no discharge. No scleral icterus.  Neck: Normal range of motion. Neck supple. No JVD present. No tracheal deviation present. No thyromegaly present.  Cardiovascular: Normal rate, regular rhythm, normal heart sounds and  intact distal pulses.  Exam reveals no gallop and no friction rub.   No murmur heard. Pulmonary/Chest: Breath sounds normal. No stridor. No respiratory distress. She has no wheezes. She has no rales. She exhibits no tenderness.  Abdominal: Soft. Bowel sounds are normal. She exhibits no distension and no mass. There is no tenderness. There is no rebound and no guarding.  Small right incisional hernia  Genitourinary:  Patient has had a hysterectomy for nonmalignant reasons or for pelvic exam and Pap not indicated.  Bilateral breast exam shows scars from previous reduction mammoplasty otherwise normal exam  Musculoskeletal: Normal range of motion.  Bilateral hip exam normal except for decreased range of motion about 75 where she has some discomfort in her hips consistent with some early osteoarthritis    Lymphadenopathy:    She has no cervical adenopathy.  Neurological: She is alert and oriented to person, place, and time. She has normal reflexes. No cranial nerve deficit. She exhibits normal muscle tone. Coordination normal.  Skin: Skin is warm and dry. No rash noted. No erythema. No pallor.  Total body skin exam normal    Psychiatric: She has a normal mood and affect. Her behavior is normal. Judgment and thought content normal.          Assessment & Plan:  Healthy female  Diabetes type 2 at goal continue current therapy  Hypertension at goal continue current therapy  Hyperlipidemia at goal continue current therapy  Overweight begin talked about diet exercise and weight loss  Allergic rhinitis continue Singulair  Vaginitis from antibiotic use OTC cream plus Diflucan  Osteoarthritis right  and left hip Motrin 400 twice a day daily exercise and weight loss

## 2013-09-01 NOTE — Patient Instructions (Signed)
Walk 30 minutes daily  Motrin 200 mg twice daily with food for hip pain  Continue other medications  Antifungal cream and Diflucan as directed for vaginitis  Return in 6 months for followup on your diabetes.......... nonfasting labs one week prior  We will get you set up for a screening colonoscopy because of your age.

## 2013-09-01 NOTE — Progress Notes (Signed)
Pre visit review using our clinic review tool, if applicable. No additional management support is needed unless otherwise documented below in the visit note. 

## 2013-09-28 ENCOUNTER — Other Ambulatory Visit: Payer: Self-pay | Admitting: Family Medicine

## 2013-09-29 ENCOUNTER — Telehealth: Payer: Self-pay | Admitting: Internal Medicine

## 2013-09-29 DIAGNOSIS — Z1211 Encounter for screening for malignant neoplasm of colon: Secondary | ICD-10-CM

## 2013-09-29 NOTE — Telephone Encounter (Signed)
Pt had a colon done in 2010. States her PCP wanted her to have a colon done now at her 61 year mark. Pt has recall in system for 2020. Explained to pt that she is due for another colon in 2020, pt wanted to check with Dr. Carlean Purl and see if she could go ahead and have one done. Pt states she is nervous due to her family having history of polyps. Dr. Carlean Purl please advise.

## 2013-09-30 NOTE — Telephone Encounter (Signed)
Pt aware and will come for iFOBT.

## 2013-09-30 NOTE — Telephone Encounter (Signed)
Unless there is new hx of colon cancer I would not do a colonoscopy now. She can do iFOBT (Hemosure) if she wants - using v76.51 as code

## 2013-10-06 ENCOUNTER — Encounter: Payer: Self-pay | Admitting: Family Medicine

## 2013-11-11 ENCOUNTER — Other Ambulatory Visit: Payer: Self-pay | Admitting: Family Medicine

## 2013-11-11 DIAGNOSIS — Z1231 Encounter for screening mammogram for malignant neoplasm of breast: Secondary | ICD-10-CM

## 2013-12-01 ENCOUNTER — Ambulatory Visit
Admission: RE | Admit: 2013-12-01 | Discharge: 2013-12-01 | Disposition: A | Discharge status: Home / Self Care | Payer: BC Managed Care – PPO | Source: Ambulatory Visit | Attending: Family Medicine | Admitting: Family Medicine

## 2013-12-01 DIAGNOSIS — M8580 Other specified disorders of bone density and structure, unspecified site: Secondary | ICD-10-CM

## 2013-12-01 DIAGNOSIS — Z1231 Encounter for screening mammogram for malignant neoplasm of breast: Secondary | ICD-10-CM

## 2013-12-01 LAB — HM DEXA SCAN

## 2013-12-08 ENCOUNTER — Encounter: Payer: Self-pay | Admitting: Family Medicine

## 2013-12-16 ENCOUNTER — Encounter: Payer: Self-pay | Admitting: Family Medicine

## 2014-02-01 ENCOUNTER — Encounter: Payer: Self-pay | Admitting: Family Medicine

## 2014-02-17 ENCOUNTER — Other Ambulatory Visit: Payer: BC Managed Care – PPO

## 2014-02-18 ENCOUNTER — Other Ambulatory Visit: Payer: BC Managed Care – PPO

## 2014-02-18 DIAGNOSIS — Z0000 Encounter for general adult medical examination without abnormal findings: Secondary | ICD-10-CM

## 2014-02-18 LAB — CBC WITH DIFFERENTIAL/PLATELET
Basophils Absolute: 0.1 10*3/uL (ref 0.0–0.1)
Basophils Relative: 0.8 % (ref 0.0–3.0)
EOS ABS: 0.3 10*3/uL (ref 0.0–0.7)
EOS PCT: 3.9 % (ref 0.0–5.0)
HCT: 44 % (ref 36.0–46.0)
Hemoglobin: 14.4 g/dL (ref 12.0–15.0)
LYMPHS ABS: 2.5 10*3/uL (ref 0.7–4.0)
Lymphocytes Relative: 36.6 % (ref 12.0–46.0)
MCHC: 32.8 g/dL (ref 30.0–36.0)
MCV: 95.4 fl (ref 78.0–100.0)
MONO ABS: 0.6 10*3/uL (ref 0.1–1.0)
Monocytes Relative: 9.1 % (ref 3.0–12.0)
Neutro Abs: 3.4 10*3/uL (ref 1.4–7.7)
Neutrophils Relative %: 49.6 % (ref 43.0–77.0)
PLATELETS: 154 10*3/uL (ref 150.0–400.0)
RBC: 4.62 Mil/uL (ref 3.87–5.11)
RDW: 13.2 % (ref 11.5–15.5)
WBC: 6.9 10*3/uL (ref 4.0–10.5)

## 2014-02-18 LAB — HEPATIC FUNCTION PANEL
ALT: 75 U/L — AB (ref 0–35)
AST: 42 U/L — ABNORMAL HIGH (ref 0–37)
Albumin: 4.2 g/dL (ref 3.5–5.2)
Alkaline Phosphatase: 50 U/L (ref 39–117)
BILIRUBIN TOTAL: 1.1 mg/dL (ref 0.2–1.2)
Bilirubin, Direct: 0.1 mg/dL (ref 0.0–0.3)
Total Protein: 6.9 g/dL (ref 6.0–8.3)

## 2014-02-18 LAB — BASIC METABOLIC PANEL
BUN: 23 mg/dL (ref 6–23)
CHLORIDE: 104 meq/L (ref 96–112)
CO2: 27 mEq/L (ref 19–32)
CREATININE: 1 mg/dL (ref 0.4–1.2)
Calcium: 9.4 mg/dL (ref 8.4–10.5)
GFR: 64.37 mL/min (ref >60.00)
GLUCOSE: 138 mg/dL — AB (ref 70–99)
Potassium: 3.6 mEq/L (ref 3.5–5.1)
Sodium: 140 mEq/L (ref 135–145)

## 2014-02-18 LAB — POCT URINALYSIS DIPSTICK
Bilirubin, UA: NEGATIVE
Glucose, UA: NEGATIVE
Ketones, UA: NEGATIVE
LEUKOCYTES UA: NEGATIVE
Nitrite, UA: NEGATIVE
PH UA: 5.5
PROTEIN UA: NEGATIVE
Spec Grav, UA: 1.025
Urobilinogen, UA: 0.2

## 2014-02-18 LAB — MICROALBUMIN / CREATININE URINE RATIO
Creatinine,U: 148.7 mg/dL
MICROALB/CREAT RATIO: 0.9 mg/g (ref 0.0–30.0)
Microalb, Ur: 1.4 mg/dL (ref 0.0–1.9)

## 2014-02-18 LAB — LIPID PANEL
Cholesterol: 154 mg/dL (ref 0–200)
HDL: 41.2 mg/dL (ref >39.00)
LDL Cholesterol: 87 mg/dL (ref 0–99)
NONHDL: 112.8
Total CHOL/HDL Ratio: 4
Triglycerides: 129 mg/dL (ref 0.0–149.0)
VLDL: 25.8 mg/dL (ref 0.0–40.0)

## 2014-02-18 LAB — TSH: TSH: 4.05 u[IU]/mL (ref 0.35–4.50)

## 2014-02-18 LAB — HEMOGLOBIN A1C: Hgb A1c MFr Bld: 6.9 % — ABNORMAL HIGH (ref 4.6–6.5)

## 2014-03-04 ENCOUNTER — Ambulatory Visit (INDEPENDENT_AMBULATORY_CARE_PROVIDER_SITE_OTHER): Payer: BC Managed Care – PPO | Admitting: Family Medicine

## 2014-03-04 ENCOUNTER — Encounter: Payer: Self-pay | Admitting: Family Medicine

## 2014-03-04 ENCOUNTER — Telehealth: Payer: Self-pay | Admitting: Family Medicine

## 2014-03-04 ENCOUNTER — Ambulatory Visit (INDEPENDENT_AMBULATORY_CARE_PROVIDER_SITE_OTHER)
Admission: RE | Admit: 2014-03-04 | Discharge: 2014-03-04 | Disposition: A | Discharge status: Home / Self Care | Payer: BC Managed Care – PPO | Source: Ambulatory Visit | Attending: Family Medicine | Admitting: Family Medicine

## 2014-03-04 VITALS — BP 110/80 | Temp 98.3°F | Ht 60.5 in | Wt 183.8 lb

## 2014-03-04 DIAGNOSIS — M25552 Pain in left hip: Secondary | ICD-10-CM

## 2014-03-04 DIAGNOSIS — Z780 Asymptomatic menopausal state: Secondary | ICD-10-CM

## 2014-03-04 DIAGNOSIS — M25551 Pain in right hip: Secondary | ICD-10-CM

## 2014-03-04 DIAGNOSIS — E119 Type 2 diabetes mellitus without complications: Secondary | ICD-10-CM

## 2014-03-04 MED ORDER — ESTROGENS CONJUGATED 0.3 MG PO TABS: ORAL_TABLET | ORAL | Status: DC

## 2014-03-04 NOTE — Patient Instructions (Signed)
We will set you up to get an x-ray of your back and hips  We within get you set up for physical therapy consult  Continue current medications  Follow-up in May for a complete exam  Fasting labs one week prior  Okay to use small amounts of over-the-counter Cortaid 10+ a moisturizer for the irritation of your eyelids

## 2014-03-04 NOTE — Telephone Encounter (Signed)
Okay to wait per Dr Sherren Mocha

## 2014-03-04 NOTE — Telephone Encounter (Signed)
Dr Sherren Mocha had her coming back in April 2016 for her physical but she had her last physical 09/14/13 which means she had to wait till after this date. She wanted to let Dr Sherren Mocha know that she scheduled her cpx for September 2016 and wanted to make sure this was all right with him if not she would see if Dr Sarajane Jews could do it. No need to contact her if Dr Sherren Mocha is ok with Sept

## 2014-03-04 NOTE — Progress Notes (Signed)
   Subjective:    Patient ID: Monica Cunningham, female    DOB: May 20, 1960, 53 y.o.   MRN: [id number]  HPI Monica Cunningham is a 53 year old we last saw her in May for general physical examination  At bedtime her what sugar was under good control on glipizide 5 mg twice a day and metformin 1000 mg twice a day. She's here today for 6 month follow-up. Her fasting blood sugar 2 weeks ago was 138 A1c 6.9%  She was supposed to get a be met and an A1c however a full panel of labs were drawn. 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 It comes and go from his past year. She lost 11 pounds but regained it mostly except for 3. She's down to 183 pounds. She takes Aleve and it helps the discomfort.  She also has periorbital dermatitis and dry skin of her upper eyelids and has been using over-the-counter Cortaid cream. It seems to help. Advised to continue that small amounts okay   Review of Systems Review of systems otherwise negative    Objective:   Physical Exam  Well-developed well-nourished female no acute distress vital signs stable she's afebrile BP at goal 110/80      Assessment & Plan:  Diabetes type 2 at goal....... continue current therapy and continue to encourage exercise diet and weight loss  Follow-up CPX in 6 months  Periorbital dermatitis.......... OTC Cortaid cream  Persistent low back pain.......Marland Kitchen refer to physical therapy

## 2014-03-04 NOTE — Progress Notes (Signed)
Pre visit review using our clinic review tool, if applicable. No additional management support is needed unless otherwise documented below in the visit note. 

## 2014-03-09 LAB — HM DIABETES EYE EXAM

## 2014-03-11 ENCOUNTER — Encounter: Payer: Self-pay | Admitting: Family Medicine

## 2014-03-30 ENCOUNTER — Other Ambulatory Visit: Payer: Self-pay | Admitting: Family Medicine

## 2014-03-30 ENCOUNTER — Other Ambulatory Visit: Payer: Self-pay | Admitting: *Deleted

## 2014-03-30 DIAGNOSIS — E119 Type 2 diabetes mellitus without complications: Secondary | ICD-10-CM

## 2014-03-30 MED ORDER — GLIPIZIDE 5 MG PO TABS: 5.0000 mg | ORAL_TABLET | Freq: Two times a day (BID) | ORAL | Status: DC

## 2014-03-30 MED ORDER — METFORMIN HCL 1000 MG PO TABS: ORAL_TABLET | ORAL | Status: DC

## 2014-07-05 ENCOUNTER — Other Ambulatory Visit: Payer: Self-pay | Admitting: Family Medicine

## 2014-08-23 ENCOUNTER — Other Ambulatory Visit: Payer: Self-pay | Admitting: *Deleted

## 2014-08-23 DIAGNOSIS — J309 Allergic rhinitis, unspecified: Secondary | ICD-10-CM

## 2014-08-23 DIAGNOSIS — E119 Type 2 diabetes mellitus without complications: Secondary | ICD-10-CM

## 2014-08-23 MED ORDER — GLIPIZIDE 5 MG PO TABS: 5.0000 mg | ORAL_TABLET | Freq: Two times a day (BID) | ORAL | Status: DC

## 2014-08-23 MED ORDER — LOSARTAN POTASSIUM-HCTZ 100-25 MG PO TABS: ORAL_TABLET | ORAL | Status: DC

## 2014-08-23 MED ORDER — CLONAZEPAM 1 MG PO TABS: ORAL_TABLET | ORAL | Status: DC

## 2014-08-23 MED ORDER — MONTELUKAST SODIUM 10 MG PO TABS: ORAL_TABLET | ORAL | Status: DC

## 2014-08-23 MED ORDER — METFORMIN HCL 1000 MG PO TABS: ORAL_TABLET | ORAL | Status: DC

## 2014-08-23 MED ORDER — SIMVASTATIN 40 MG PO TABS: 40.0000 mg | ORAL_TABLET | Freq: Every day | ORAL | Status: DC

## 2014-09-29 ENCOUNTER — Encounter: Payer: Self-pay | Admitting: Internal Medicine

## 2014-10-21 ENCOUNTER — Encounter: Payer: Self-pay | Admitting: Family Medicine

## 2014-10-21 ENCOUNTER — Ambulatory Visit (INDEPENDENT_AMBULATORY_CARE_PROVIDER_SITE_OTHER): Payer: BLUE CROSS/BLUE SHIELD | Admitting: Family Medicine

## 2014-10-21 VITALS — BP 139/82 | HR 70 | Temp 98.8°F | Ht 60.5 in | Wt 178.0 lb

## 2014-10-21 DIAGNOSIS — J019 Acute sinusitis, unspecified: Secondary | ICD-10-CM | POA: Diagnosis not present

## 2014-10-21 MED ORDER — AZITHROMYCIN 250 MG PO TABS: ORAL_TABLET | ORAL | Status: DC

## 2014-10-21 MED ORDER — HYDROCODONE-HOMATROPINE 5-1.5 MG/5ML PO SYRP: 5.0000 mL | ORAL_SOLUTION | ORAL | Status: DC | PRN

## 2014-10-21 NOTE — Progress Notes (Signed)
   Subjective:    Patient ID: Monica Cunningham, female    DOB: 08-21-1960, 54 y.o.   MRN: [id number]  HPI Here for 4 days of stuffy head, PND, ST, and a dry cough.    Review of Systems  Constitutional: Negative.   HENT: Positive for congestion, postnasal drip and sinus pressure.   Eyes: Negative.   Respiratory: Positive for cough.        Objective:   Physical Exam  Constitutional: She appears well-developed and well-nourished.  HENT:  Right Ear: External ear normal.  Left Ear: External ear normal.  Nose: Nose normal.  Mouth/Throat: Oropharynx is clear and moist.  Eyes: Conjunctivae are normal.  Pulmonary/Chest: Effort normal. No respiratory distress. She has no wheezes. She has no rales.  Lymphadenopathy:    She has no cervical adenopathy.          Assessment & Plan:  Sinusitis. Given a Zpack

## 2014-10-21 NOTE — Progress Notes (Signed)
Pre visit review using our clinic review tool, if applicable. No additional management support is needed unless otherwise documented below in the visit note. 

## 2014-11-09 ENCOUNTER — Other Ambulatory Visit: Payer: Self-pay

## 2014-11-11 ENCOUNTER — Telehealth: Payer: Self-pay | Admitting: Family Medicine

## 2014-11-11 ENCOUNTER — Other Ambulatory Visit: Payer: Self-pay | Admitting: *Deleted

## 2014-11-11 MED ORDER — ONETOUCH VERIO W/DEVICE KIT: 1.0000 | PACK | Freq: Every day | Status: DC

## 2014-11-11 MED ORDER — ONETOUCH DELICA LANCETS 33G MISC: Status: DC

## 2014-11-11 MED ORDER — GLUCOSE BLOOD VI STRP: ORAL_STRIP | Status: DC

## 2014-11-11 MED ORDER — ONETOUCH ULTRASOFT LANCETS MISC
Status: DC
Start: 2014-11-11 — End: 2014-11-11

## 2014-11-11 NOTE — Telephone Encounter (Signed)
Note from pharmacy Verio machine uses [facility], please send a new script to [facility].

## 2014-11-11 NOTE — Telephone Encounter (Signed)
New Rx sent.

## 2014-11-29 ENCOUNTER — Other Ambulatory Visit (INDEPENDENT_AMBULATORY_CARE_PROVIDER_SITE_OTHER): Payer: BLUE CROSS/BLUE SHIELD

## 2014-11-29 DIAGNOSIS — Z0000 Encounter for general adult medical examination without abnormal findings: Secondary | ICD-10-CM

## 2014-11-29 LAB — CBC WITH DIFFERENTIAL/PLATELET
BASOS ABS: 0.1 10*3/uL (ref 0.0–0.1)
BASOS PCT: 0.7 % (ref 0.0–3.0)
EOS ABS: 0.2 10*3/uL (ref 0.0–0.7)
EOS PCT: 2.8 % (ref 0.0–5.0)
HCT: 43.3 % (ref 36.0–46.0)
Hemoglobin: 14.7 g/dL (ref 12.0–15.0)
LYMPHS ABS: 3.2 10*3/uL (ref 0.7–4.0)
Lymphocytes Relative: 39.3 % (ref 12.0–46.0)
MCHC: 33.9 g/dL (ref 30.0–36.0)
MCV: 92.9 fl (ref 78.0–100.0)
Monocytes Absolute: 0.8 10*3/uL (ref 0.1–1.0)
Monocytes Relative: 9.3 % (ref 3.0–12.0)
Neutro Abs: 3.9 10*3/uL (ref 1.4–7.7)
Neutrophils Relative %: 47.9 % (ref 43.0–77.0)
PLATELETS: 186 10*3/uL (ref 150.0–400.0)
RBC: 4.66 Mil/uL (ref 3.87–5.11)
RDW: 12.6 % (ref 11.5–15.5)
WBC: 8.2 10*3/uL (ref 4.0–10.5)

## 2014-11-29 LAB — TSH: TSH: 4.21 u[IU]/mL (ref 0.35–4.50)

## 2014-11-29 LAB — HEPATIC FUNCTION PANEL
ALBUMIN: 4.4 g/dL (ref 3.5–5.2)
ALK PHOS: 57 U/L (ref 39–117)
ALT: 62 U/L — ABNORMAL HIGH (ref 0–35)
AST: 28 U/L (ref 0–37)
BILIRUBIN DIRECT: 0.2 mg/dL (ref 0.0–0.3)
Total Bilirubin: 0.9 mg/dL (ref 0.2–1.2)
Total Protein: 7 g/dL (ref 6.0–8.3)

## 2014-11-29 LAB — HEMOGLOBIN A1C: HEMOGLOBIN A1C: 6.9 % — AB (ref 4.6–6.5)

## 2014-11-29 LAB — POCT URINALYSIS DIPSTICK
BILIRUBIN UA: NEGATIVE
GLUCOSE UA: NEGATIVE
Ketones, UA: NEGATIVE
Nitrite, UA: NEGATIVE
Spec Grav, UA: >=1.03
UROBILINOGEN UA: 0.2
pH, UA: 5.5

## 2014-11-29 LAB — BASIC METABOLIC PANEL
BUN: 17 mg/dL (ref 6–23)
CHLORIDE: 104 meq/L (ref 96–112)
CO2: 27 meq/L (ref 19–32)
Calcium: 9.7 mg/dL (ref 8.4–10.5)
Creatinine, Ser: 0.91 mg/dL (ref 0.40–1.20)
GFR: 69.08 mL/min (ref >60.00)
GLUCOSE: 134 mg/dL — AB (ref 70–99)
POTASSIUM: 3.8 meq/L (ref 3.5–5.1)
Sodium: 142 mEq/L (ref 135–145)

## 2014-11-29 LAB — LIPID PANEL
CHOL/HDL RATIO: 4
CHOLESTEROL: 145 mg/dL (ref 0–200)
HDL: 37.5 mg/dL — ABNORMAL LOW (ref >39.00)
LDL CALC: 70 mg/dL (ref 0–99)
NonHDL: 107.13
TRIGLYCERIDES: 185 mg/dL — AB (ref 0.0–149.0)
VLDL: 37 mg/dL (ref 0.0–40.0)

## 2014-11-29 LAB — MICROALBUMIN / CREATININE URINE RATIO
Creatinine,U: 242.5 mg/dL
MICROALB/CREAT RATIO: 2.5 mg/g (ref 0.0–30.0)
Microalb, Ur: 6 mg/dL — ABNORMAL HIGH (ref 0.0–1.9)

## 2014-12-02 ENCOUNTER — Other Ambulatory Visit: Payer: Self-pay

## 2014-12-02 DIAGNOSIS — Z1231 Encounter for screening mammogram for malignant neoplasm of breast: Secondary | ICD-10-CM

## 2014-12-07 ENCOUNTER — Ambulatory Visit (INDEPENDENT_AMBULATORY_CARE_PROVIDER_SITE_OTHER): Payer: BLUE CROSS/BLUE SHIELD | Admitting: Family Medicine

## 2014-12-07 ENCOUNTER — Encounter: Payer: Self-pay | Admitting: Family Medicine

## 2014-12-07 VITALS — Temp 98.3°F | Ht 61.0 in | Wt 177.0 lb

## 2014-12-07 DIAGNOSIS — E785 Hyperlipidemia, unspecified: Secondary | ICD-10-CM

## 2014-12-07 DIAGNOSIS — Z0000 Encounter for general adult medical examination without abnormal findings: Secondary | ICD-10-CM | POA: Diagnosis not present

## 2014-12-07 DIAGNOSIS — E119 Type 2 diabetes mellitus without complications: Secondary | ICD-10-CM

## 2014-12-07 DIAGNOSIS — N760 Acute vaginitis: Secondary | ICD-10-CM

## 2014-12-07 DIAGNOSIS — L719 Rosacea, unspecified: Secondary | ICD-10-CM

## 2014-12-07 DIAGNOSIS — I10 Essential (primary) hypertension: Secondary | ICD-10-CM

## 2014-12-07 DIAGNOSIS — J309 Allergic rhinitis, unspecified: Secondary | ICD-10-CM

## 2014-12-07 DIAGNOSIS — Z23 Encounter for immunization: Secondary | ICD-10-CM

## 2014-12-07 MED ORDER — TERCONAZOLE 0.8 % VA CREA: 1.0000 | TOPICAL_CREAM | Freq: Every day | VAGINAL | Status: DC

## 2014-12-07 MED ORDER — AZELAIC ACID 15 % EX GEL: CUTANEOUS | Status: DC

## 2014-12-07 MED ORDER — MONTELUKAST SODIUM 10 MG PO TABS: ORAL_TABLET | ORAL | Status: DC

## 2014-12-07 MED ORDER — SULFACETAMIDE SODIUM-SULFUR 10-5 % EX EMUL: CUTANEOUS | Status: DC

## 2014-12-07 MED ORDER — LOSARTAN POTASSIUM-HCTZ 100-25 MG PO TABS: ORAL_TABLET | ORAL | Status: DC

## 2014-12-07 MED ORDER — METFORMIN HCL 1000 MG PO TABS: ORAL_TABLET | ORAL | Status: DC

## 2014-12-07 MED ORDER — SIMVASTATIN 40 MG PO TABS: 40.0000 mg | ORAL_TABLET | Freq: Every day | ORAL | Status: DC

## 2014-12-07 MED ORDER — GLIPIZIDE 5 MG PO TABS: 5.0000 mg | ORAL_TABLET | Freq: Two times a day (BID) | ORAL | Status: DC

## 2014-12-07 NOTE — Progress Notes (Signed)
Lab Results  Component Value Date   HGBA1C 6.9* 11/29/2014   HGBA1C 6.9* 02/18/2014   HGBA1C 7.3* 08/18/2013   Lab Results  Component Value Date   MICROALBUR 6.0* 11/29/2014   LDLCALC 70 11/29/2014   CREATININE 0.91 11/29/2014

## 2014-12-07 NOTE — Progress Notes (Signed)
   Subjective:    Patient ID: Monica Cunningham, female    DOB: 1960/04/03, 54 y.o.   MRN: [id number]  HPI  [name] is a 54 year old divorced female nonsmoker, G2 P2........ daughter [name] son 77 years of age is now moved in with his father because of issues with drug abuse.... Who comes in today for general physical examination because of history of diabetes type 2, obesity, hyperlipidemia, hypertension, sleep dysfunction, recurrent vaginitis, allergic rhinitis,  She says overall she feels well physically. She begin a walking program this summer which improved her blood sugar control.  She gets routine eye care, dental care, BSE monthly, annual mammography, colonoscopy 2010 normal, vaccinations updated by Apolonio Schneiders  She had her uterus out for nonmalignant reasons. Ovaries were left intact. Therefore no longer needs pelvics or Paps.  She is on losartan for hypertension because she is intolerant of ace inhibitors. BP 120/80     Review of Systems  Constitutional: Negative.   HENT: Negative.   Eyes: Negative.   Respiratory: Negative.   Cardiovascular: Negative.   Gastrointestinal: Negative.   Endocrine: Negative.   Genitourinary: Negative.   Musculoskeletal: Negative.   Skin: Negative.   Allergic/Immunologic: Negative.   Neurological: Negative.   Hematological: Negative.   Psychiatric/Behavioral: Negative.        Objective:   Physical Exam  Constitutional: She appears well-developed and well-nourished.  HENT:  Head: Normocephalic and atraumatic.  Right Ear: External ear normal.  Left Ear: External ear normal.  Nose: Nose normal.  Mouth/Throat: Oropharynx is clear and moist.  Eyes: EOM are normal. Pupils are equal, round, and reactive to light.  Neck: Normal range of motion. Neck supple. No JVD present. No tracheal deviation present. No thyromegaly present.  Cardiovascular: Normal rate, regular rhythm, normal heart sounds and intact distal pulses.  Exam reveals no gallop and  no friction rub.   No murmur heard. No carotid neurologic bruits peripheral pulses 2+ and symmetrical  Pulmonary/Chest: Effort normal and breath sounds normal. No stridor. No respiratory distress. She has no wheezes. She has no rales. She exhibits no tenderness.  Abdominal: Soft. Bowel sounds are normal. She exhibits no distension and no mass. There is no tenderness. There is no rebound and no guarding.  Genitourinary:  Bilateral breast exam normal except for scars from previous reduction mammoplasty    Musculoskeletal: Normal range of motion.  Lymphadenopathy:    She has no cervical adenopathy.  Neurological: She is alert. She has normal reflexes. No cranial nerve deficit. She exhibits normal muscle tone. Coordination normal.  Skin: Skin is warm and dry. No rash noted. No erythema. No pallor.  Total body skin exam normal  Also scar in the lower abdomen from previous abdominoplasty  Psychiatric: She has a normal mood and affect. Her behavior is normal. Judgment and thought content normal.  Nursing note and vitals reviewed.         Assessment & Plan:  Healthy female  Diabetes type 2......... at goal A1c 6.9%..... continue current medication..... Resume exercise program.... Follow-up in 6 months  Hypertension at goal.......... continue the Hyzaar 100-25...Marland KitchenMarland KitchenMarland Kitchen one tablet daily....... patient intolerant of ace inhibitors gave her severe cough  Hyperlipidemia goal........ continue Zocor and aspirin  Sleep dysfunction........ continue Klonopin at bedtime When necessary  Allergic rhinitis.......... continue Singulair  Recurrent vaginitis refill medications to use on a when necessary basis  Status post hysterectomy,,,,,,,, asymptomatic

## 2014-12-07 NOTE — Progress Notes (Signed)
Pre visit review using our clinic review tool, if applicable. No additional management support is needed unless otherwise documented below in the visit note. 

## 2014-12-07 NOTE — Patient Instructions (Signed)
Continue current medications  Most importantly walk 30 minutes daily  Return in March for follow-up  Nonfasting labs one week prior

## 2014-12-08 ENCOUNTER — Other Ambulatory Visit: Payer: Self-pay | Admitting: *Deleted

## 2014-12-08 DIAGNOSIS — E119 Type 2 diabetes mellitus without complications: Secondary | ICD-10-CM

## 2014-12-08 MED ORDER — LOSARTAN POTASSIUM-HCTZ 100-25 MG PO TABS: ORAL_TABLET | ORAL | Status: DC

## 2014-12-09 ENCOUNTER — Other Ambulatory Visit: Payer: Self-pay | Admitting: Family Medicine

## 2014-12-17 ENCOUNTER — Encounter: Payer: Self-pay | Admitting: *Deleted

## 2014-12-17 DIAGNOSIS — E119 Type 2 diabetes mellitus without complications: Secondary | ICD-10-CM

## 2014-12-17 MED ORDER — LOSARTAN POTASSIUM-HCTZ 100-25 MG PO TABS: ORAL_TABLET | ORAL | Status: DC

## 2015-01-12 ENCOUNTER — Ambulatory Visit
Admission: RE | Admit: 2015-01-12 | Discharge: 2015-01-12 | Disposition: A | Discharge status: Home / Self Care | Payer: BLUE CROSS/BLUE SHIELD | Source: Ambulatory Visit

## 2015-01-12 DIAGNOSIS — Z1231 Encounter for screening mammogram for malignant neoplasm of breast: Secondary | ICD-10-CM

## 2015-03-07 ENCOUNTER — Other Ambulatory Visit: Payer: Self-pay | Admitting: Family Medicine

## 2015-03-09 ENCOUNTER — Other Ambulatory Visit: Payer: Self-pay | Admitting: Family Medicine

## 2015-03-09 MED ORDER — LOSARTAN POTASSIUM-HCTZ 100-25 MG PO TABS: ORAL_TABLET | ORAL | Status: DC

## 2015-04-05 ENCOUNTER — Ambulatory Visit (INDEPENDENT_AMBULATORY_CARE_PROVIDER_SITE_OTHER): Payer: BLUE CROSS/BLUE SHIELD | Admitting: Family Medicine

## 2015-04-05 ENCOUNTER — Encounter: Payer: Self-pay | Admitting: Family Medicine

## 2015-04-05 VITALS — BP 128/80 | Temp 98.0°F | Ht 61.0 in | Wt 183.3 lb

## 2015-04-05 DIAGNOSIS — L989 Disorder of the skin and subcutaneous tissue, unspecified: Secondary | ICD-10-CM | POA: Diagnosis not present

## 2015-04-05 NOTE — Progress Notes (Signed)
   Subjective:    Patient ID: Monica Cunningham, female    DOB: 06/03/1960, 55 y.o.   MRN: [id number]  HPI Here to check a skin lesion on the right forearm that appeared about 3 months ago. It started as a flat brown lesion that has been constantly changing since then. It has become inflamed and itches. It has become raised and has turned red or pink.    Review of Systems  Constitutional: Negative.   Skin: Positive for color change.       Objective:   Physical Exam  Constitutional: She appears well-developed and well-nourished.  Skin:  There is a lesion on the right forearm about 3 mm in diameter. Part is macular and part is papular. Part is pink and part is brown.           Assessment & Plan:  This lesion is concerning due to that fact that it is changing in appearance. Melanoma would be a concern. We will refer to the [facility] to evaluate further.

## 2015-04-05 NOTE — Progress Notes (Signed)
Pre visit review using our clinic review tool, if applicable. No additional management support is needed unless otherwise documented below in the visit note. 

## 2015-05-31 ENCOUNTER — Other Ambulatory Visit: Payer: BLUE CROSS/BLUE SHIELD

## 2015-06-07 ENCOUNTER — Ambulatory Visit: Payer: BLUE CROSS/BLUE SHIELD | Admitting: Family Medicine

## 2015-06-07 ENCOUNTER — Other Ambulatory Visit: Payer: Self-pay | Admitting: Family Medicine

## 2015-06-14 ENCOUNTER — Ambulatory Visit: Payer: BLUE CROSS/BLUE SHIELD | Admitting: Family Medicine

## 2015-06-28 ENCOUNTER — Other Ambulatory Visit: Payer: Self-pay | Admitting: Family Medicine

## 2015-06-28 ENCOUNTER — Other Ambulatory Visit (INDEPENDENT_AMBULATORY_CARE_PROVIDER_SITE_OTHER): Payer: BLUE CROSS/BLUE SHIELD

## 2015-06-28 DIAGNOSIS — I10 Essential (primary) hypertension: Secondary | ICD-10-CM | POA: Diagnosis not present

## 2015-06-28 DIAGNOSIS — E119 Type 2 diabetes mellitus without complications: Secondary | ICD-10-CM

## 2015-06-28 DIAGNOSIS — D649 Anemia, unspecified: Secondary | ICD-10-CM | POA: Diagnosis not present

## 2015-06-28 LAB — MICROALBUMIN / CREATININE URINE RATIO
Creatinine,U: 265.8 mg/dL
MICROALB UR: 9.9 mg/dL — AB (ref 0.0–1.9)
MICROALB/CREAT RATIO: 3.7 mg/g (ref 0.0–30.0)

## 2015-06-28 LAB — CBC WITH DIFFERENTIAL/PLATELET
Basophils Absolute: 0.1 10*3/uL (ref 0.0–0.1)
Basophils Relative: 1 % (ref 0.0–3.0)
EOS ABS: 0.2 10*3/uL (ref 0.0–0.7)
Eosinophils Relative: 2.9 % (ref 0.0–5.0)
HCT: 45 % (ref 36.0–46.0)
HEMOGLOBIN: 15.3 g/dL — AB (ref 12.0–15.0)
LYMPHS ABS: 2.6 10*3/uL (ref 0.7–4.0)
Lymphocytes Relative: 39.2 % (ref 12.0–46.0)
MCHC: 34 g/dL (ref 30.0–36.0)
MCV: 92.7 fl (ref 78.0–100.0)
MONO ABS: 0.5 10*3/uL (ref 0.1–1.0)
Monocytes Relative: 8.2 % (ref 3.0–12.0)
NEUTROS PCT: 48.7 % (ref 43.0–77.0)
Neutro Abs: 3.3 10*3/uL (ref 1.4–7.7)
PLATELETS: 201 10*3/uL (ref 150.0–400.0)
RBC: 4.85 Mil/uL (ref 3.87–5.11)
RDW: 12.6 % (ref 11.5–15.5)
WBC: 6.7 10*3/uL (ref 4.0–10.5)

## 2015-06-28 LAB — BASIC METABOLIC PANEL
BUN: 20 mg/dL (ref 6–23)
CALCIUM: 9.8 mg/dL (ref 8.4–10.5)
CO2: 25 meq/L (ref 19–32)
CREATININE: 0.85 mg/dL (ref 0.40–1.20)
Chloride: 105 mEq/L (ref 96–112)
GFR: 74.57 mL/min (ref >60.00)
Glucose, Bld: 166 mg/dL — ABNORMAL HIGH (ref 70–99)
Potassium: 4 mEq/L (ref 3.5–5.1)
SODIUM: 141 meq/L (ref 135–145)

## 2015-06-28 LAB — HEMOGLOBIN A1C: HEMOGLOBIN A1C: 7.9 % — AB (ref 4.6–6.5)

## 2015-07-05 ENCOUNTER — Ambulatory Visit (INDEPENDENT_AMBULATORY_CARE_PROVIDER_SITE_OTHER): Payer: BLUE CROSS/BLUE SHIELD | Admitting: Family Medicine

## 2015-07-05 ENCOUNTER — Encounter: Payer: Self-pay | Admitting: Family Medicine

## 2015-07-05 VITALS — BP 120/84 | Temp 99.1°F | Wt 181.0 lb

## 2015-07-05 DIAGNOSIS — E119 Type 2 diabetes mellitus without complications: Secondary | ICD-10-CM | POA: Diagnosis not present

## 2015-07-05 MED ORDER — GLIPIZIDE 10 MG PO TABS: 10.0000 mg | ORAL_TABLET | Freq: Two times a day (BID) | ORAL | Status: DC

## 2015-07-05 NOTE — Progress Notes (Signed)
Pre visit review using our clinic review tool, if applicable. No additional management support is needed unless otherwise documented below in the visit note. 

## 2015-07-05 NOTE — Progress Notes (Signed)
   Subjective:    Patient ID: Monica Cunningham, female    DOB: Nov 08, 1960, 55 y.o.   MRN: [id number]  HPI Jarrah is a 55 year old divorced female nonsmoker ,,,,,,,,,,,,,,, she is an Optometrist by trade,,,,, who comes in today for follow-up of diabetes type 2  She is on metformin 1000 mg twice a day Glucotrol 5 twice a day A1c 7.9% blood sugar 166  We talked about all the different strategies. Taxis and is very difficult for her. She works long hours and is not able to eat properly or exercise    also discussed the microalbumin and its significance however renal function is normal with a normal GFR but this is not a good sign  We talked about changing oral medicines, adding insulin, getting in endocrinology consult etc. etc. She would like to increase her Glucotrol for now and see if she can't get her blood sugar normal   Review of Systems    review of systems otherwise negative Objective:   Physical Exam Well-developed well-nourished female no acute distress vital signs stable she's afebrile       Assessment & Plan:  Diabetes type 2 not at goal,,,,,,, continue metformin 1000 mg twice a day, Glucotrol 10 twice a day follow-up A1c in September ,,,,,,,,,,,,,,,,

## 2015-07-05 NOTE — Patient Instructions (Signed)
Work heart on your diet and exercise program  Metformin 1000 mg,,,,,,,,, one before breakfast one before your evening female  Glucotrol 10 mg,,,,,,,,,,, one before breakfast and one before your evening female,,,,,,,,,,, however if you start having episodes of hypoglycemia,,,,,,,,,,,,, decrease your Glucotrol to 10 mg in the morning and 5 mg before your evening female  Follow-up labs in 3 months nonfasting

## 2015-07-19 DIAGNOSIS — F4323 Adjustment disorder with mixed anxiety and depressed mood: Secondary | ICD-10-CM | POA: Diagnosis not present

## 2015-09-02 ENCOUNTER — Other Ambulatory Visit: Payer: Self-pay | Admitting: Family Medicine

## 2015-10-11 ENCOUNTER — Other Ambulatory Visit (INDEPENDENT_AMBULATORY_CARE_PROVIDER_SITE_OTHER): Payer: BLUE CROSS/BLUE SHIELD

## 2015-10-11 DIAGNOSIS — E119 Type 2 diabetes mellitus without complications: Secondary | ICD-10-CM

## 2015-10-11 DIAGNOSIS — I10 Essential (primary) hypertension: Secondary | ICD-10-CM | POA: Diagnosis not present

## 2015-10-11 LAB — HEMOGLOBIN A1C: HEMOGLOBIN A1C: 6.8 % — AB (ref 4.6–6.5)

## 2015-10-11 LAB — BASIC METABOLIC PANEL
BUN: 19 mg/dL (ref 6–23)
CHLORIDE: 102 meq/L (ref 96–112)
CO2: 27 meq/L (ref 19–32)
Calcium: 9.8 mg/dL (ref 8.4–10.5)
Creatinine, Ser: 0.93 mg/dL (ref 0.40–1.20)
GFR: 67.14 mL/min (ref >60.00)
Glucose, Bld: 100 mg/dL — ABNORMAL HIGH (ref 70–99)
Potassium: 3.7 mEq/L (ref 3.5–5.1)
SODIUM: 139 meq/L (ref 135–145)

## 2015-10-12 DIAGNOSIS — F4323 Adjustment disorder with mixed anxiety and depressed mood: Secondary | ICD-10-CM | POA: Diagnosis not present

## 2015-10-18 ENCOUNTER — Encounter: Payer: Self-pay | Admitting: Family Medicine

## 2015-10-18 ENCOUNTER — Ambulatory Visit (INDEPENDENT_AMBULATORY_CARE_PROVIDER_SITE_OTHER): Payer: BLUE CROSS/BLUE SHIELD | Admitting: Family Medicine

## 2015-10-18 VITALS — BP 115/77 | HR 60 | Temp 98.4°F | Ht 61.0 in | Wt 176.0 lb

## 2015-10-18 DIAGNOSIS — E785 Hyperlipidemia, unspecified: Secondary | ICD-10-CM | POA: Diagnosis not present

## 2015-10-18 DIAGNOSIS — E119 Type 2 diabetes mellitus without complications: Secondary | ICD-10-CM

## 2015-10-18 DIAGNOSIS — I10 Essential (primary) hypertension: Secondary | ICD-10-CM | POA: Diagnosis not present

## 2015-10-18 MED ORDER — DESONIDE 0.05 % EX LOTN: TOPICAL_LOTION | Freq: Two times a day (BID) | CUTANEOUS | Status: DC

## 2015-10-18 NOTE — Progress Notes (Signed)
Pre visit review using our clinic review tool, if applicable. No additional management support is needed unless otherwise documented below in the visit note. 

## 2015-10-18 NOTE — Progress Notes (Signed)
   Subjective:    Patient ID: Monica Cunningham, female    DOB: 10-Apr-1960, 55 y.o.   MRN: [id number]  HPI Here to follow up. She feels great and her A1c last week was down to 6.8%. Her glucoses at home average from 80 to 120 most of the time. She stays active and watches her diet.    Review of Systems  Constitutional: Negative.   Respiratory: Negative.   Cardiovascular: Negative.   Endocrine: Negative.   Neurological: Negative.        Objective:   Physical Exam  Constitutional: She is oriented to person, place, and time. She appears well-developed and well-nourished.  Neck: No thyromegaly present.  Cardiovascular: Normal rate, regular rhythm, normal heart sounds and intact distal pulses.   Pulmonary/Chest: Effort normal and breath sounds normal.  Lymphadenopathy:    She has no cervical adenopathy.  Neurological: She is alert and oriented to person, place, and time.          Assessment & Plan:  Her DM is stable. Her HTN is stable. The last time her lipids were checked was August 2016. We will plan to get fasting labs in 90 days to include a lipid panel and an A1c.  Laurey Morale, MD

## 2015-11-29 DIAGNOSIS — L821 Other seborrheic keratosis: Secondary | ICD-10-CM | POA: Diagnosis not present

## 2015-11-29 DIAGNOSIS — L218 Other seborrheic dermatitis: Secondary | ICD-10-CM | POA: Diagnosis not present

## 2015-11-29 DIAGNOSIS — L814 Other melanin hyperpigmentation: Secondary | ICD-10-CM | POA: Diagnosis not present

## 2015-11-29 DIAGNOSIS — L719 Rosacea, unspecified: Secondary | ICD-10-CM | POA: Diagnosis not present

## 2015-11-30 ENCOUNTER — Other Ambulatory Visit: Payer: Self-pay | Admitting: Family Medicine

## 2015-11-30 DIAGNOSIS — Z1231 Encounter for screening mammogram for malignant neoplasm of breast: Secondary | ICD-10-CM

## 2015-12-06 DIAGNOSIS — F4323 Adjustment disorder with mixed anxiety and depressed mood: Secondary | ICD-10-CM | POA: Diagnosis not present

## 2015-12-22 ENCOUNTER — Other Ambulatory Visit: Payer: Self-pay | Admitting: Family Medicine

## 2015-12-22 DIAGNOSIS — J309 Allergic rhinitis, unspecified: Secondary | ICD-10-CM

## 2015-12-22 DIAGNOSIS — E119 Type 2 diabetes mellitus without complications: Secondary | ICD-10-CM

## 2015-12-26 ENCOUNTER — Other Ambulatory Visit: Payer: Self-pay | Admitting: Family Medicine

## 2015-12-26 DIAGNOSIS — J309 Allergic rhinitis, unspecified: Secondary | ICD-10-CM

## 2015-12-26 MED ORDER — MONTELUKAST SODIUM 10 MG PO TABS: 10.0000 mg | ORAL_TABLET | Freq: Every day | ORAL | 0 refills | Status: DC

## 2015-12-26 MED ORDER — METFORMIN HCL 1000 MG PO TABS: 1000.0000 mg | ORAL_TABLET | Freq: Two times a day (BID) | ORAL | 0 refills | Status: DC

## 2015-12-26 NOTE — Addendum Note (Signed)
Addended by: Sharon Seller B on: 12/26/2015 04:12 PM   Modules accepted: Orders

## 2016-01-03 ENCOUNTER — Other Ambulatory Visit (INDEPENDENT_AMBULATORY_CARE_PROVIDER_SITE_OTHER): Payer: BLUE CROSS/BLUE SHIELD

## 2016-01-03 DIAGNOSIS — Z0000 Encounter for general adult medical examination without abnormal findings: Secondary | ICD-10-CM | POA: Diagnosis not present

## 2016-01-03 LAB — BASIC METABOLIC PANEL
BUN: 19 mg/dL (ref 6–23)
CHLORIDE: 104 meq/L (ref 96–112)
CO2: 27 mEq/L (ref 19–32)
CREATININE: 0.92 mg/dL (ref 0.40–1.20)
Calcium: 9.2 mg/dL (ref 8.4–10.5)
GFR: 67.92 mL/min (ref >60.00)
Glucose, Bld: 158 mg/dL — ABNORMAL HIGH (ref 70–99)
Potassium: 4 mEq/L (ref 3.5–5.1)
Sodium: 141 mEq/L (ref 135–145)

## 2016-01-03 LAB — HEPATIC FUNCTION PANEL
ALT: 41 U/L — AB (ref 0–35)
AST: 25 U/L (ref 0–37)
Albumin: 4.1 g/dL (ref 3.5–5.2)
Alkaline Phosphatase: 54 U/L (ref 39–117)
BILIRUBIN DIRECT: 0.2 mg/dL (ref 0.0–0.3)
BILIRUBIN TOTAL: 1.2 mg/dL (ref 0.2–1.2)
Total Protein: 6.5 g/dL (ref 6.0–8.3)

## 2016-01-03 LAB — HEMOGLOBIN A1C: Hgb A1c MFr Bld: 6.6 % — ABNORMAL HIGH (ref 4.6–6.5)

## 2016-01-03 LAB — CBC WITH DIFFERENTIAL/PLATELET
BASOS PCT: 1 % (ref 0.0–3.0)
Basophils Absolute: 0.1 10*3/uL (ref 0.0–0.1)
EOS ABS: 0.2 10*3/uL (ref 0.0–0.7)
Eosinophils Relative: 2.6 % (ref 0.0–5.0)
HCT: 43.6 % (ref 36.0–46.0)
HEMOGLOBIN: 14.9 g/dL (ref 12.0–15.0)
Lymphocytes Relative: 37.7 % (ref 12.0–46.0)
Lymphs Abs: 2.5 10*3/uL (ref 0.7–4.0)
MCHC: 34.2 g/dL (ref 30.0–36.0)
MCV: 93.1 fl (ref 78.0–100.0)
MONO ABS: 0.5 10*3/uL (ref 0.1–1.0)
Monocytes Relative: 7.8 % (ref 3.0–12.0)
Neutro Abs: 3.4 10*3/uL (ref 1.4–7.7)
Neutrophils Relative %: 50.9 % (ref 43.0–77.0)
PLATELETS: 173 10*3/uL (ref 150.0–400.0)
RBC: 4.69 Mil/uL (ref 3.87–5.11)
RDW: 13.5 % (ref 11.5–15.5)
WBC: 6.7 10*3/uL (ref 4.0–10.5)

## 2016-01-03 LAB — LIPID PANEL
CHOL/HDL RATIO: 4
Cholesterol: 157 mg/dL (ref 0–200)
HDL: 39 mg/dL — AB (ref >39.00)
LDL CALC: 85 mg/dL (ref 0–99)
NonHDL: 117.66
TRIGLYCERIDES: 163 mg/dL — AB (ref 0.0–149.0)
VLDL: 32.6 mg/dL (ref 0.0–40.0)

## 2016-01-03 LAB — POC URINALSYSI DIPSTICK (AUTOMATED)
BILIRUBIN UA: NEGATIVE
GLUCOSE UA: NEGATIVE
KETONES UA: NEGATIVE
Nitrite, UA: NEGATIVE
SPEC GRAV UA: 1.025
Urobilinogen, UA: 0.2
pH, UA: 5

## 2016-01-03 LAB — MICROALBUMIN / CREATININE URINE RATIO
Creatinine,U: 214.4 mg/dL
MICROALB/CREAT RATIO: 2.7 mg/g (ref 0.0–30.0)
Microalb, Ur: 5.7 mg/dL — ABNORMAL HIGH (ref 0.0–1.9)

## 2016-01-03 LAB — TSH: TSH: 4.27 u[IU]/mL (ref 0.35–4.50)

## 2016-01-10 ENCOUNTER — Ambulatory Visit (INDEPENDENT_AMBULATORY_CARE_PROVIDER_SITE_OTHER): Payer: BLUE CROSS/BLUE SHIELD | Admitting: Family Medicine

## 2016-01-10 ENCOUNTER — Encounter: Payer: Self-pay | Admitting: Family Medicine

## 2016-01-10 VITALS — BP 118/80 | HR 71 | Temp 98.5°F | Wt 177.0 lb

## 2016-01-10 DIAGNOSIS — Z23 Encounter for immunization: Secondary | ICD-10-CM | POA: Diagnosis not present

## 2016-01-10 DIAGNOSIS — E785 Hyperlipidemia, unspecified: Secondary | ICD-10-CM | POA: Diagnosis not present

## 2016-01-10 DIAGNOSIS — E119 Type 2 diabetes mellitus without complications: Secondary | ICD-10-CM | POA: Diagnosis not present

## 2016-01-10 DIAGNOSIS — Z0000 Encounter for general adult medical examination without abnormal findings: Secondary | ICD-10-CM

## 2016-01-10 DIAGNOSIS — N760 Acute vaginitis: Secondary | ICD-10-CM

## 2016-01-10 DIAGNOSIS — L719 Rosacea, unspecified: Secondary | ICD-10-CM | POA: Diagnosis not present

## 2016-01-10 DIAGNOSIS — I10 Essential (primary) hypertension: Secondary | ICD-10-CM

## 2016-01-10 DIAGNOSIS — E282 Polycystic ovarian syndrome: Secondary | ICD-10-CM

## 2016-01-10 MED ORDER — NADOLOL 20 MG PO TABS: ORAL_TABLET | ORAL | Status: DC

## 2016-01-10 MED ORDER — TERCONAZOLE 0.8 % VA CREA: 1.0000 | TOPICAL_CREAM | Freq: Every day | VAGINAL | 6 refills | Status: DC

## 2016-01-10 MED ORDER — ZOLMITRIPTAN 2.5 MG PO TBDP
2.5000 mg | ORAL_TABLET | ORAL | 4 refills | Status: DC | PRN
Start: 2016-01-10 — End: 2016-08-29

## 2016-01-10 MED ORDER — CLONAZEPAM 1 MG PO TABS: ORAL_TABLET | ORAL | 1 refills | Status: DC

## 2016-01-10 MED ORDER — LOSARTAN POTASSIUM-HCTZ 100-25 MG PO TABS: ORAL_TABLET | ORAL | 3 refills | Status: DC

## 2016-01-10 MED ORDER — GLIPIZIDE 10 MG PO TABS: 10.0000 mg | ORAL_TABLET | Freq: Two times a day (BID) | ORAL | 3 refills | Status: DC

## 2016-01-10 MED ORDER — FLUCONAZOLE 100 MG PO TABS: 100.0000 mg | ORAL_TABLET | Freq: Every day | ORAL | 3 refills | Status: DC

## 2016-01-10 MED ORDER — SIMVASTATIN 40 MG PO TABS: 40.0000 mg | ORAL_TABLET | Freq: Every day | ORAL | 3 refills | Status: DC

## 2016-01-10 MED ORDER — METFORMIN HCL 1000 MG PO TABS: 1000.0000 mg | ORAL_TABLET | Freq: Two times a day (BID) | ORAL | 3 refills | Status: DC

## 2016-01-10 MED ORDER — METRONIDAZOLE 1 % EX GEL: Freq: Every day | CUTANEOUS | 10 refills | Status: DC

## 2016-01-10 MED ORDER — SULFACETAMIDE SODIUM-SULFUR 10-5 % EX EMUL: CUTANEOUS | 4 refills | Status: DC

## 2016-01-10 MED ORDER — MONTELUKAST SODIUM 10 MG PO TABS: 10.0000 mg | ORAL_TABLET | Freq: Every day | ORAL | 3 refills | Status: DC

## 2016-01-10 NOTE — Progress Notes (Signed)
Monica Cunningham is a 55 year old divorced female nonsmoker,,,,,, tax specialist by trade,,,,,, who comes in today for general physical examination because of a history of diabetes, hypertension, obesity, allergic rhinitis, hyperlipidemia, obesity,. Classic metabolic syndrome.  For hypertension she takes Hyzaar 100-25 daily BP is 118/80  For hyperlipidemia takes Zocor 40 mg daily and an aspirin tablet. Lipids are at goal  For diabetes she takes metformin 1000 mg twice a day Glucotrol 10 mg twice a day. Blood sugar 158 A1c 6.6.  She also uses a number of creams and lotions for skin  She takes Klonopin 1 mg at bedtime when necessary  She takes Singulair 10 mg daily for allergic rhinitis.  She had her uterus removed 13 years ago for nonmalignant reasons. She has occasional fungal infection. She keeps Terazol and Diflucan around. Denies any vaginal dryness. Ovaries were left intact.  New problem of sexual-induced headaches. She says she's had 3 episodes was severe headaches with sex. She's had many migraines in the past.  Weight is unchanged 176 although she is walking daily. She still not on a carbohydrate free diet.  She had a colonoscopy 2010 cousin father had colon polyps. Her colonoscopy was normal.  She gets routine eye care, dental care,  Vaccinations seasonal flu shot given today no history of depression.  14 points review of system is reviewed and are negative except for above  Physical examination vital signs stable she's afebrile HEENT were negative neck was supple no adenopathy thyroid normal no carotid bruits cardiopulmonary exam normal abdominal exam normal pelvic rectal deferred. Extremities normal skin normal peripheral pulses normal except for scars below her breast from previous reduction mammoplasty in her midline lower abdomen from previous abdominoplasty.  Impression  #1 hypertension at goal....... continue current therapy note  #2 diabetes type 2 at goal..... Continue current  therapy walk daily recommend Mediterranean diet for weight loss  #3 hyperlipidemia.....Marland Kitchen continue Zocor and aspirin  #4 obesity.......... again stressed diet exercise and weight loss  #5 rosacea.......Marland Kitchen refill creams  #6 allergic rhinitis............... continue Singulair  #7 sleep dysfunction..... Klonopin when necessary    #8 migraine headaches sex-induced.......Marland Kitchen prophylactic beta blocker and Zomig sublingual when necessary

## 2016-01-10 NOTE — Patient Instructions (Signed)
Try the Mediterranean diet........... no carbohydrates  Walk 30 minutes daily  Follow-up A1c in 6 months

## 2016-01-10 NOTE — Progress Notes (Signed)
Pre visit review using our clinic review tool, if applicable. No additional management support is needed unless otherwise documented below in the visit note. 

## 2016-01-13 ENCOUNTER — Telehealth: Payer: Self-pay | Admitting: Family Medicine

## 2016-01-13 NOTE — Telephone Encounter (Signed)
Please advise on refills for nadolol (CORGARD) 20 MG tablet  No number in amount. Please call pharmacy

## 2016-01-16 ENCOUNTER — Other Ambulatory Visit: Payer: Self-pay | Admitting: Emergency Medicine

## 2016-01-16 MED ORDER — NADOLOL 20 MG PO TABS: ORAL_TABLET | ORAL | 4 refills | Status: DC

## 2016-01-16 NOTE — Telephone Encounter (Signed)
Error corrected and resent to pharmacy.

## 2016-01-17 ENCOUNTER — Ambulatory Visit: Payer: BLUE CROSS/BLUE SHIELD

## 2016-02-28 DIAGNOSIS — F4323 Adjustment disorder with mixed anxiety and depressed mood: Secondary | ICD-10-CM | POA: Diagnosis not present

## 2016-04-23 ENCOUNTER — Telehealth: Payer: Self-pay | Admitting: Family Medicine

## 2016-04-23 DIAGNOSIS — E119 Type 2 diabetes mellitus without complications: Secondary | ICD-10-CM

## 2016-04-23 NOTE — Telephone Encounter (Signed)
Pt would like to proceed with referral to  Dr Renne Crigler .  Pt states she cannot get her blood sugars under control.

## 2016-04-23 NOTE — Telephone Encounter (Signed)
Referral has been placed for pt to go to endocrinologist

## 2016-04-26 ENCOUNTER — Encounter: Payer: Self-pay | Admitting: Internal Medicine

## 2016-04-26 ENCOUNTER — Ambulatory Visit (INDEPENDENT_AMBULATORY_CARE_PROVIDER_SITE_OTHER): Payer: BLUE CROSS/BLUE SHIELD | Admitting: Internal Medicine

## 2016-04-26 VITALS — BP 142/94 | HR 104 | Ht 61.0 in | Wt 180.0 lb

## 2016-04-26 DIAGNOSIS — E1165 Type 2 diabetes mellitus with hyperglycemia: Secondary | ICD-10-CM

## 2016-04-26 LAB — POCT GLYCOSYLATED HEMOGLOBIN (HGB A1C): Hemoglobin A1C: 7.6

## 2016-04-26 MED ORDER — CANAGLIFLOZIN 100 MG PO TABS: 100.0000 mg | ORAL_TABLET | Freq: Every day | ORAL | 5 refills | Status: DC

## 2016-04-26 NOTE — Addendum Note (Signed)
Addended by: Caprice Beaver T on: 04/26/2016 12:56 PM   Modules accepted: Orders

## 2016-04-26 NOTE — Patient Instructions (Signed)
Please try to move all Metformin at night (2000 mg).  Continue Glipizide 10 mg 2x a day 15 min before a meal.  Start Invokana 100 mg daily before b'fast.  Please let me know if the sugars are consistently <80 or >200.  Please return in 1.5 months with your sugar log.   PATIENT INSTRUCTIONS FOR TYPE 2 DIABETES:  DIET AND EXERCISE Diet and exercise is an important part of diabetic treatment.  We recommended aerobic exercise in the form of brisk walking (working between 40-60% of maximal aerobic capacity, similar to brisk walking) for 150 minutes per week (such as 30 minutes five days per week) along with 3 times per week performing 'resistance' training (using various gauge rubber tubes with handles) 5-10 exercises involving the major muscle groups (upper body, lower body and core) performing 10-15 repetitions (or near fatigue) each exercise. Start at half the above goal but build slowly to reach the above goals. If limited by weight, joint pain, or disability, we recommend daily walking in a swimming pool with water up to waist to reduce pressure from joints while allow for adequate exercise.    BLOOD GLUCOSES Monitoring your blood glucoses is important for continued management of your diabetes. Please check your blood glucoses 2-4 times a day: fasting, before meals and at bedtime (you can rotate these measurements - e.g. one day check before the 3 meals, the next day check before 2 of the meals and before bedtime, etc.).   HYPOGLYCEMIA (low blood sugar) Hypoglycemia is usually a reaction to not eating, exercising, or taking too much insulin/ other diabetes drugs.  Symptoms include tremors, sweating, hunger, confusion, headache, etc. Treat IMMEDIATELY with 15 grams of Carbs: . 4 glucose tablets .  cup regular juice/soda . 2 tablespoons raisins . 4 teaspoons sugar . 1 tablespoon honey Recheck blood glucose in 15 mins and repeat above if still symptomatic/blood glucose  <100.  RECOMMENDATIONS TO REDUCE YOUR RISK OF DIABETIC COMPLICATIONS: * Take your prescribed MEDICATION(S) * Follow a DIABETIC diet: Complex carbs, fiber rich foods, (monounsaturated and polyunsaturated) fats * AVOID saturated/trans fats, high fat foods, >2,300 mg salt per day. * EXERCISE at least 5 times a week for 30 minutes or preferably daily.  * DO NOT SMOKE OR DRINK more than 1 drink a day. * Check your FEET every day. Do not wear tightfitting shoes. Contact us if you develop an ulcer * See your EYE doctor once a year or more if needed * Get a FLU shot once a year * Get a PNEUMONIA vaccine once before and once after age 25 years  GOALS:  * Your Hemoglobin A1c of <7%  * fasting sugars need to be <130 * after meals sugars need to be <180 (2h after you start eating) * Your Systolic BP should be [id number] or lower  * Your Diastolic BP should be 80 or lower  * Your HDL (Good Cholesterol) should be 40 or higher  * Your LDL (Bad Cholesterol) should be 100 or lower. * Your Triglycerides should be 150 or lower  * Your Urine microalbumin (kidney function) should be <30 * Your Body Mass Index should be 25 or lower    Please consider the following ways to cut down carbs and fat and increase fiber and micronutrients in your diet: - substitute whole grain for white bread or pasta - substitute brown rice for white rice - substitute 90-calorie flat bread pieces for slices of bread when possible - substitute sweet potatoes or yams  for white potatoes - substitute humus for margarine - substitute tofu for cheese when possible - substitute almond or rice milk for regular milk (would not drink soy milk daily due to concern for soy estrogen influence on breast cancer risk) - substitute dark chocolate for other sweets when possible - substitute water - can add lemon or orange slices for taste - for diet sodas (artificial sweeteners will trick your body that you can eat sweets without getting calories and  will lead you to overeating and weight gain in the long run) - do not skip breakfast or other meals (this will slow down the metabolism and will result in more weight gain over time)  - can try smoothies made from fruit and almond/rice milk in am instead of regular breakfast - can also try old-fashioned (not instant) oatmeal made with almond/rice milk in am - order the dressing on the side when eating salad at a restaurant (pour less than half of the dressing on the salad) - eat as little meat as possible - can try juicing, but should not forget that juicing will get rid of the fiber, so would alternate with eating raw veg./fruits or drinking smoothies - use as little oil as possible, even when using olive oil - can dress a salad with a mix of balsamic vinegar and lemon juice, for e.g. - use agave nectar, stevia sugar, or regular sugar rather than artificial sweateners - steam or broil/roast veggies  - snack on veggies/fruit/nuts (unsalted, preferably) when possible, rather than processed foods - reduce or eliminate aspartame in diet (it is in diet sodas, chewing gum, etc) Read the labels!  Try to read Dr. Janene Harvey book: "Program for Reversing Diabetes" for other ideas for healthy eating.

## 2016-04-26 NOTE — Progress Notes (Signed)
Patient ID: Monica Cunningham, female   DOB: [birth year], 56 y.o.   MRN: [id number]   HPI: Monica Cunningham is a 56 y.o.-year-old female, referred by her PCP, Dr. Sherren Mocha, for management of DM2, dx as GDM in 2003, then DM2 in 2005, non-insulin-dependent, uncontrolled, without long term complications.  Last hemoglobin A1c was: 04/26/2016: HbA1c 7.6% Lab Results  Component Value Date   HGBA1C 6.6 (H) 01/03/2016   HGBA1C 6.8 (H) 10/11/2015   HGBA1C 7.9 (H) 06/28/2015   Pt is on a regimen of: - Metformin 1000 mg with b'fast and at bedtime - Glipizide 10 mg 2x a day  Pt checks her sugars 5-10x a day latelyand they are: - am: 165, 170-220 (ave 191) - 2h after b'fast: 161-191 - before lunch: 167-219 - 2h after lunch: n/c175-196 - before dinner: 126-187 - 2h after dinner: 129-199 - bedtime: 132-181, 243 - nighttime: n/c No lows. Lowest sugar was 46 after exercise >> not recently; she has hypoglycemia awareness at 70.  Highest sugar was 358 - candy - before starting Glipizide.  Glucometer: [employer]  Pt's meals are: - Breakfast: chicken tenders and PB; 1 egg; prev. muffins - Lunch: meat + veggies or salad - Dinner: protein, veggies, pizza; hamburger - Snacks: 3-4, diet soda, unsweet tea  She started walking in the treadmill. She also has a stationary bike, which she started to use.  - no CKD, last BUN/creatinine:  Lab Results  Component Value Date   BUN 19 01/03/2016   BUN 19 10/11/2015   CREATININE 0.92 01/03/2016   CREATININE 0.93 10/11/2015  On. Losartan. - + HL. Last set of lipids: Lab Results  Component Value Date   CHOL 157 01/03/2016   HDL 39.00 (L) 01/03/2016   LDLCALC 85 01/03/2016   LDLDIRECT 120.9 04/15/2012   TRIG 163.0 (H) 01/03/2016   CHOLHDL 4 01/03/2016  On Zocor. - last eye exam was in 03/2014. No DR.  - no numbness and tingling in her feet.  Pt has FH of DM in M, M aunt, other relatives on her M's side.  She also has a h/o HTN, HL, PCOS, HAs (on Nadolol)  - no HAs recently.  ROS: Constitutional: no weight gain/loss, + fatigue, + subjective hyperthermia, hot flushes, + mental fog, + poor sleep Eyes: no blurry vision, no xerophthalmia ENT: no sore throat, no nodules palpated in throat, no dysphagia/odynophagia, no hoarseness Cardiovascular: + CP/no SOB/palpitations/leg swelling Respiratory: no cough/SOB Gastrointestinal: no N/V/D/C/+ heartburn Musculoskeletal: no muscle/joint aches Skin: no rashes, + excessive hair growth (PCOS) Neurological: no tremors/numbness/tingling/dizziness Psychiatric: no depression/anxiety  Past Medical History:  Diagnosis Date  . ALLERGIC RHINITIS 09/15/2007  . DM (diabetes mellitus) ([place])   . Endometriosis   . Essential hypertension, benign 01/26/2007  . Fibroid   . HYPERLIPIDEMIA 09/15/2007  . Insomnia   . Obesity   . PCOS (polycystic ovarian syndrome)   . POLYCYSTIC OVARIAN DISEASE 09/15/2007  . Rosacea    Past Surgical History:  Procedure Laterality Date  . ABDOMINAL HYSTERECTOMY    . CESAREAN SECTION     x2  . CHOLECYSTECTOMY    . COLONOSCOPY  01/21/2009   normal  . PELVIC LAPAROSCOPY    . REDUCTION MAMMAPLASTY    . REFRACTIVE SURGERY    . TUBAL LIGATION    . tummy tuck     Social History   Social History  . Marital status: Divorced    Spouse name: N/A  . Number of children: 3   Occupational History  .  Accountant    Social History Main Topics  . Smoking status: Former Smoker    Packs/day: 0.25    Quit date: 2013  . Smokeless tobacco: Never Used  . Alcohol use     1-2 Glasses of wine per week       . Drug use: No  . Sexual activity: Yes    Birth control/ protection: Surgical   Current Outpatient Prescriptions on File Prior to Visit  Medication Sig Dispense Refill  . BAYER CONTOUR NEXT TEST test strip USE UP TO THREE TIMES A DAY 300 each 3  . clonazePAM (KLONOPIN) 1 MG tablet TAKE ONE TABLET BY MOUTH ONCE DAILY AS NEEDED 90 tablet 1  . fluconazole (DIFLUCAN) 100 MG tablet  Take 1 tablet (100 mg total) by mouth daily. 3 tablet 3  . glipiZIDE (GLUCOTROL) 10 MG tablet Take 1 tablet (10 mg total) by mouth 2 (two) times daily before a meal. 200 tablet 3  . losartan-hydrochlorothiazide (HYZAAR) 100-25 MG tablet TAKE ONE TABLET BY MOUTH once DAILY 90 tablet 3  . metFORMIN (GLUCOPHAGE) 1000 MG tablet Take 1 tablet (1,000 mg total) by mouth 2 (two) times daily. 180 tablet 3  . metroNIDAZOLE (METROGEL) 1 % gel Apply topically daily. 45 g 10  . montelukast (SINGULAIR) 10 MG tablet Take 1 tablet (10 mg total) by mouth daily. 90 tablet 3  . simvastatin (ZOCOR) 40 MG tablet Take 1 tablet (40 mg total) by mouth at bedtime. 90 tablet 3  . Sulfacetamide Sodium-Sulfur 10-5 % EMUL Apply small amounts once daily 227 g 4  . terconazole (TERAZOL 3) 0.8 % vaginal cream Place 1 applicator vaginally at bedtime. 20 g 6  . zolmitriptan (ZOMIG ZMT) 2.5 MG disintegrating tablet Take 1 tablet (2.5 mg total) by mouth as needed for migraine. 10 tablet 4  . nadolol (CORGARD) 20 MG tablet 1 tablet 3 hours prior to sex (Patient not taking: Reported on 04/26/2016) 30 tablet 4   No current facility-administered medications on file prior to visit.    Allergies  Allergen Reactions  . Codeine     REACTION: nausea   Family History  Problem Relation Age of Onset  . Hypertension Mother   . Diabetes Mother   . Colon polyps Father   . Hypertension Father   . Heart disease Father   . Heart disease Maternal Grandmother   . Diabetes Sister     gestational  . Hypertension Sister   . Hypertension Brother   . Heart disease Maternal Grandfather   . Cancer Paternal Grandmother     Kidney cancer  . Kidney cancer Paternal Grandmother     PE: BP (!) 142/94 (BP Location: Left Arm, Patient Position: Sitting)   Pulse (!) 104   Ht 5\' 1"  (1.549 m)   Wt 180 lb (81.6 kg)   SpO2 96%   BMI 34.01 kg/m  Wt Readings from Last 3 Encounters:  04/26/16 180 lb (81.6 kg)  01/10/16 177 lb (80.3 kg)  10/18/15  176 lb (79.8 kg)   Constitutional: overweight, in NAD Eyes: PERRLA, EOMI, no exophthalmos ENT: moist mucous membranes, no thyromegaly, no cervical lymphadenopathy Cardiovascular: RRR, No MRG Respiratory: CTA B Gastrointestinal: abdomen soft, NT, ND, BS+ Musculoskeletal: no deformities, strength intact in all 4 Skin: moist, warm, no rashes Neurological: no tremor with outstretched hands, DTR normal in all 4  ASSESSMENT: 1. DM2, non-insulin-dependent, uncontrolled, without long term complications, but with hyperglycemia  PLAN:  1. Patient with long-standing, uncontrolled diabetes, on oral antidiabetic  regimen, which became insufficient. He sugars are higher in last 2 months and her HbA1c has increased to 7.6% today. She has adjusted her diet an we discussed about further improvements. She also started exercise, which is great! However, she still need help especially with am sugars >> I suggested to move all Metformin at night and also add Invokana. - we discussed about SEs of Invokana, which are: dizziness (advised to be careful when stands from sitting position), decreased BP - usually not < normal (BP today is not low), and fungal UTIs (advised to let me know if develops one). She does have a history of yeast infections, but not in last year. Invokana will also help with her high blood pressure. - I suggested to:  Patient Instructions  Please try to move all Metformin at night (2000 mg).  Continue Glipizide 10 mg 2x a day 15 min before a meal.  Start Invokana 100 mg daily before b'fast.  Please let me know if the sugars are consistently <80 or >200.  Please return in 1.5 months with your sugar log.   - check  sugars at different times of the day - check at least 2 times a day, rotating checks - given sugar log and advised how to fill it and to bring it at next appt  - given foot care handout and explained the principles  - given instructions for hypoglycemia management "15-15 rule"   - advised for yearly eye exams >> she needs one - Return to clinic in 1.5 mo with sugar log   Philemon Kingdom, MD PhD [facility]

## 2016-05-22 ENCOUNTER — Ambulatory Visit (INDEPENDENT_AMBULATORY_CARE_PROVIDER_SITE_OTHER): Payer: BLUE CROSS/BLUE SHIELD | Admitting: Psychology

## 2016-05-22 DIAGNOSIS — F4323 Adjustment disorder with mixed anxiety and depressed mood: Secondary | ICD-10-CM | POA: Diagnosis not present

## 2016-06-15 ENCOUNTER — Encounter: Payer: Self-pay | Admitting: Internal Medicine

## 2016-06-15 ENCOUNTER — Ambulatory Visit (INDEPENDENT_AMBULATORY_CARE_PROVIDER_SITE_OTHER): Payer: BLUE CROSS/BLUE SHIELD | Admitting: Internal Medicine

## 2016-06-15 VITALS — BP 120/82 | HR 80 | Wt 175.0 lb

## 2016-06-15 DIAGNOSIS — E1165 Type 2 diabetes mellitus with hyperglycemia: Secondary | ICD-10-CM

## 2016-06-15 LAB — BASIC METABOLIC PANEL WITH GFR
BUN: 18 mg/dL (ref 7–25)
CHLORIDE: 103 mmol/L (ref 98–110)
CO2: 24 mmol/L (ref 20–31)
CREATININE: 0.78 mg/dL (ref 0.50–1.05)
Calcium: 9.9 mg/dL (ref 8.6–10.4)
GFR, Est African American: >89 mL/min (ref >=60)
GFR, Est Non African American: 87 mL/min (ref >=60)
Glucose, Bld: 82 mg/dL (ref 65–99)
Potassium: 4 mmol/L (ref 3.5–5.3)
Sodium: 141 mmol/L (ref 135–146)

## 2016-06-15 MED ORDER — GLUCOSE BLOOD VI STRP: ORAL_STRIP | 3 refills | Status: DC

## 2016-06-15 MED ORDER — GLIPIZIDE 5 MG PO TABS: 5.0000 mg | ORAL_TABLET | Freq: Two times a day (BID) | ORAL | 3 refills | Status: DC

## 2016-06-15 NOTE — Patient Instructions (Addendum)
Please continue: - Metformin at night (2000 mg). - Invokana 100 mg daily before b'fast.  Please decrease Glipizide to 5 mg 2x a day. If you eat no carbs for dinner, skip the Glipizide.  Please return in 2 months with your sugar log.

## 2016-06-15 NOTE — Progress Notes (Signed)
Patient ID: Monica Cunningham, female   DOB: [birth year], 56 y.o.   MRN: [id number]   HPI: Monica Cunningham is a 56 y.o.-year-old female,returning for f/u for DM2, dx as GDM in 2003, then DM2 in 2005, non-insulin-dependent, uncontrolled, without long term complications. Last visit 1.5 mo ago.  Last hemoglobin A1c was: Lab Results  Component Value Date   HGBA1C 7.6 04/26/2016   HGBA1C 6.6 (H) 01/03/2016   HGBA1C 6.8 (H) 10/11/2015   Pt is on a regimen of: - Metformin at night (2000 mg). - Glipizide 10 mg 2x a day 15 min before a meal. - Invokana 100 mg daily before b'fast - started 04/2016  Pt checks her sugars 5-10x a day: - am: 165, 170-220 (ave 191) >> 92-132 - 2h after b'fast: 161-191 >> 82, 119, 162 - before lunch: 167-219 >> 56 x1, 87-130 - 2h after lunch: n/c175-196 >> 97-173 - before dinner: 126-187 >> 86-144 - 2h after dinner: 129-199 >> 123-174 - bedtime: 132-181, 243 >> 74-223 - nighttime: n/c No lows. Lowest sugar was 46 after exercise >> 56; she has hypoglycemia awareness at 70.  Highest sugar was 358 - candy - before starting Glipizide >> 200s now`  Glucometer: [employer]  Pt's meals are: - Breakfast: chicken tenders and PB; 1 egg; prev. muffins - Lunch: meat + veggies or salad - Dinner: protein, veggies, pizza; hamburger - Snacks: 3-4, diet soda, unsweet tea  She is walking in the treadmill + using a stationary bike,.  - no CKD, last BUN/creatinine:  Lab Results  Component Value Date   BUN 19 01/03/2016   BUN 19 10/11/2015   CREATININE 0.92 01/03/2016   CREATININE 0.93 10/11/2015  On. Losartan. - + HL. Last set of lipids: Lab Results  Component Value Date   CHOL 157 01/03/2016   HDL 39.00 (L) 01/03/2016   LDLCALC 85 01/03/2016   LDLDIRECT 120.9 04/15/2012   TRIG 163.0 (H) 01/03/2016   CHOLHDL 4 01/03/2016  On Zocor. - last eye exam was in 03/2014. No DR.  - no numbness and tingling in her feet.  She also has a h/o HTN, HL, PCOS, HAs (on Nadolol) -  no HAs recently.  ROS: Constitutional: + weight loss, no fatigue, no subjective hyperthermia, hot flushes Eyes: no blurry vision, no xerophthalmia ENT: no sore throat, no nodules palpated in throat, no dysphagia/odynophagia, no hoarseness Cardiovascular: no CP/SOB/palpitations/leg swelling Respiratory: no cough/SOB Gastrointestinal: no N/V/D/C/heartburn Musculoskeletal: no muscle/joint aches Skin: no rashes, + excessive hair growth (PCOS) Neurological: no tremors/numbness/tingling/dizziness  I reviewed pt's medications, allergies, PMH, social hx, family hx, and changes were documented in the history of present illness. Otherwise, unchanged from my initial visit note.  Past Medical History:  Diagnosis Date  . ALLERGIC RHINITIS 09/15/2007  . DM (diabetes mellitus) ([place])   . Endometriosis   . Essential hypertension, benign 01/26/2007  . Fibroid   . HYPERLIPIDEMIA 09/15/2007  . Insomnia   . Obesity   . PCOS (polycystic ovarian syndrome)   . POLYCYSTIC OVARIAN DISEASE 09/15/2007  . Rosacea    Past Surgical History:  Procedure Laterality Date  . ABDOMINAL HYSTERECTOMY    . CESAREAN SECTION     x2  . CHOLECYSTECTOMY    . COLONOSCOPY  01/21/2009   normal  . PELVIC LAPAROSCOPY    . REDUCTION MAMMAPLASTY    . REFRACTIVE SURGERY    . TUBAL LIGATION    . tummy tuck     Social History   Social History  .  Marital status: Divorced    Spouse name: N/A  . Number of children: 3   Occupational History  . Accountant    Social History Main Topics  . Smoking status: Former Smoker    Packs/day: 0.25    Quit date: 2013  . Smokeless tobacco: Never Used  . Alcohol use     1-2 Glasses of wine per week       . Drug use: No  . Sexual activity: Yes    Birth control/ protection: Surgical   Current Outpatient Prescriptions on File Prior to Visit  Medication Sig Dispense Refill  . BAYER CONTOUR NEXT TEST test strip USE UP TO THREE TIMES A DAY 300 each 3  . canagliflozin (INVOKANA) 100  MG TABS tablet Take 1 tablet (100 mg total) by mouth daily before breakfast. 30 tablet 5  . clonazePAM (KLONOPIN) 1 MG tablet TAKE ONE TABLET BY MOUTH ONCE DAILY AS NEEDED 90 tablet 1  . fluconazole (DIFLUCAN) 100 MG tablet Take 1 tablet (100 mg total) by mouth daily. 3 tablet 3  . glipiZIDE (GLUCOTROL) 10 MG tablet Take 1 tablet (10 mg total) by mouth 2 (two) times daily before a meal. (Patient taking differently: Take 10 mg by mouth 2 (two) times daily before a meal. ) 200 tablet 3  . losartan-hydrochlorothiazide (HYZAAR) 100-25 MG tablet TAKE ONE TABLET BY MOUTH once DAILY 90 tablet 3  . metFORMIN (GLUCOPHAGE) 1000 MG tablet Take 1 tablet (1,000 mg total) by mouth 2 (two) times daily. 180 tablet 3  . metroNIDAZOLE (METROGEL) 1 % gel Apply topically daily. 45 g 10  . montelukast (SINGULAIR) 10 MG tablet Take 1 tablet (10 mg total) by mouth daily. 90 tablet 3  . nadolol (CORGARD) 20 MG tablet 1 tablet 3 hours prior to sex 30 tablet 4  . simvastatin (ZOCOR) 40 MG tablet Take 1 tablet (40 mg total) by mouth at bedtime. 90 tablet 3  . Sulfacetamide Sodium-Sulfur 10-5 % EMUL Apply small amounts once daily 227 g 4  . terconazole (TERAZOL 3) 0.8 % vaginal cream Place 1 applicator vaginally at bedtime. (Patient taking differently: Place 1 applicator vaginally daily as needed. ) 20 g 6  . zolmitriptan (ZOMIG ZMT) 2.5 MG disintegrating tablet Take 1 tablet (2.5 mg total) by mouth as needed for migraine. 10 tablet 4   No current facility-administered medications on file prior to visit.    Allergies  Allergen Reactions  . Codeine     REACTION: nausea   Family History  Problem Relation Age of Onset  . Hypertension Mother   . Diabetes Mother   . Colon polyps Father   . Hypertension Father   . Heart disease Father   . Heart disease Maternal Grandmother   . Diabetes Sister     gestational  . Hypertension Sister   . Hypertension Brother   . Heart disease Maternal Grandfather   . Cancer Paternal  Grandmother     Kidney cancer  . Kidney cancer Paternal Grandmother     PE: BP 120/82 (BP Location: Left Arm, Patient Position: Sitting)   Pulse 80   Wt 175 lb (79.4 kg)   SpO2 94%   BMI 33.07 kg/m  Wt Readings from Last 3 Encounters:  06/15/16 175 lb (79.4 kg)  04/26/16 180 lb (81.6 kg)  01/10/16 177 lb (80.3 kg)   Constitutional: overweight, in NAD Eyes: PERRLA, EOMI, no exophthalmos ENT: moist mucous membranes, no thyromegaly, no cervical lymphadenopathy Cardiovascular: RRR, No MRG Respiratory: CTA B  Gastrointestinal: abdomen soft, NT, ND, BS+ Musculoskeletal: no deformities, strength intact in all 4 Skin: moist, warm, no rashes Neurological: no tremor with outstretched hands, DTR normal in all 4  ASSESSMENT: 1. DM2, non-insulin-dependent, uncontrolled, without long term complications, but with hyperglycemia  PLAN:  1. Patient with long-standing, uncontrolled diabetes, on oral antidiabetic regimen, to which we added Invokana at last visit. - last HbA1c was higher, at 7.6% at last visit. Since then, she has also adjusted her diet and started exercise. She lost 5 lbs from last visit and her sugars are much better! She had few lows >> will decrease Glipizide dose. - I suggested to:  Patient Instructions  Please continue: - Metformin at night (2000 mg). - Invokana 100 mg daily before b'fast.  Please decrease Glipizide to 5 mg 2x a day. If you eat no carbs for dinner, skip the Glipizide.  Please return in 2 months with your sugar log.   - continue to check  sugars at different times of the day - check at least 2 times a day, rotating checks  - again advised for yearly eye exams >> she needs one  - will check a BMP since we started Invokana at last visit - Return to clinic in 2 mo with sugar log   Office Visit on 06/15/2016  Component Date Value Ref Range Status  . Sodium 06/15/2016 141  135 - 146 mmol/L Final  . Potassium 06/15/2016 4.0  3.5 - 5.3 mmol/L Final  .  Chloride 06/15/2016 103  98 - 110 mmol/L Final  . CO2 06/15/2016 24  20 - 31 mmol/L Final  . Glucose, Bld 06/15/2016 82  65 - 99 mg/dL Final  . BUN 06/15/2016 18  7 - 25 mg/dL Final  . Creat 06/15/2016 0.78  0.50 - 1.05 mg/dL Final   Comment:   For patients > or = 54 years of age: The upper reference limit for Creatinine is approximately 13% higher for people identified as African-American.     . Calcium 06/15/2016 9.9  8.6 - 10.4 mg/dL Final  . GFR, Est African American 06/15/2016 >89  >=60 mL/min Final  . GFR, Est Non African American 06/15/2016 87  >=60 mL/min Final   GFR and potassium normal. We can continue with iNVOKANA.  Philemon Kingdom, MD PhD [facility] End[facility]

## 2016-06-26 ENCOUNTER — Telehealth: Payer: Self-pay | Admitting: Internal Medicine

## 2016-06-26 ENCOUNTER — Telehealth: Payer: Self-pay

## 2016-06-26 ENCOUNTER — Encounter: Payer: Self-pay | Admitting: Internal Medicine

## 2016-06-26 NOTE — Telephone Encounter (Signed)
Called patient and advised of appointments. I advised patient that Dr.Gherghe was her diabetes specialist, and patient would like to see Dr.Todd and Dr.Gherghe to help cover her diabetes. Patient asked if she could mychart Dr.Gherghe to ask, I explained she could to get her opinion. Patient understood.

## 2016-06-26 NOTE — Telephone Encounter (Signed)
Patient asked if she still need to see Gherghe the 29th for her A1C, She has an appt to see Dr Sherren Mocha the 30th for an A1C as well. She do not want to pay to co pays. please advise

## 2016-06-26 NOTE — Telephone Encounter (Signed)
Called and explained, patient will mychart Dr.Gherghe.

## 2016-07-17 ENCOUNTER — Other Ambulatory Visit: Payer: BLUE CROSS/BLUE SHIELD

## 2016-07-18 ENCOUNTER — Other Ambulatory Visit: Payer: BLUE CROSS/BLUE SHIELD

## 2016-07-24 ENCOUNTER — Ambulatory Visit: Payer: BLUE CROSS/BLUE SHIELD | Admitting: Family Medicine

## 2016-07-25 ENCOUNTER — Ambulatory Visit: Payer: BLUE CROSS/BLUE SHIELD | Admitting: Family Medicine

## 2016-08-21 ENCOUNTER — Other Ambulatory Visit (INDEPENDENT_AMBULATORY_CARE_PROVIDER_SITE_OTHER): Payer: BLUE CROSS/BLUE SHIELD

## 2016-08-21 DIAGNOSIS — E119 Type 2 diabetes mellitus without complications: Secondary | ICD-10-CM

## 2016-08-21 LAB — BASIC METABOLIC PANEL
BUN: 22 mg/dL (ref 6–23)
CO2: 28 mEq/L (ref 19–32)
Calcium: 10.1 mg/dL (ref 8.4–10.5)
Chloride: 103 mEq/L (ref 96–112)
Creatinine, Ser: 0.94 mg/dL (ref 0.40–1.20)
GFR: 66.1 mL/min (ref >60.00)
GLUCOSE: 168 mg/dL — AB (ref 70–99)
POTASSIUM: 4.2 meq/L (ref 3.5–5.1)
Sodium: 141 mEq/L (ref 135–145)

## 2016-08-21 LAB — HEMOGLOBIN A1C: Hgb A1c MFr Bld: 6.7 % — ABNORMAL HIGH (ref 4.6–6.5)

## 2016-08-22 ENCOUNTER — Other Ambulatory Visit: Payer: BLUE CROSS/BLUE SHIELD

## 2016-08-28 ENCOUNTER — Ambulatory Visit: Payer: BLUE CROSS/BLUE SHIELD | Admitting: Family Medicine

## 2016-08-28 ENCOUNTER — Ambulatory Visit: Payer: Self-pay | Admitting: Internal Medicine

## 2016-08-29 ENCOUNTER — Encounter: Payer: Self-pay | Admitting: Family Medicine

## 2016-08-29 ENCOUNTER — Ambulatory Visit: Payer: BLUE CROSS/BLUE SHIELD | Admitting: Family Medicine

## 2016-08-29 ENCOUNTER — Ambulatory Visit (INDEPENDENT_AMBULATORY_CARE_PROVIDER_SITE_OTHER): Payer: BLUE CROSS/BLUE SHIELD | Admitting: Family Medicine

## 2016-08-29 VITALS — BP 108/72 | Temp 98.3°F | Wt 177.5 lb

## 2016-08-29 DIAGNOSIS — I10 Essential (primary) hypertension: Secondary | ICD-10-CM | POA: Diagnosis not present

## 2016-08-29 DIAGNOSIS — E7800 Pure hypercholesterolemia, unspecified: Secondary | ICD-10-CM

## 2016-08-29 DIAGNOSIS — E1165 Type 2 diabetes mellitus with hyperglycemia: Secondary | ICD-10-CM | POA: Diagnosis not present

## 2016-08-29 MED ORDER — ZOLMITRIPTAN 2.5 MG PO TBDP: 2.5000 mg | ORAL_TABLET | ORAL | 10 refills | Status: DC | PRN

## 2016-08-29 MED ORDER — CLONAZEPAM 1 MG PO TABS: ORAL_TABLET | ORAL | 3 refills | Status: DC

## 2016-08-29 NOTE — Patient Instructions (Signed)
Continue current treatment program  Walk 30 minutes daily  Trimmed your toenails as we discussed............ if there is any sign of infection redness heat pain or draining pus call immediately  Follow-up the second week in October for your annual checkup

## 2016-08-29 NOTE — Progress Notes (Signed)
Monica Cunningham is a 56 year old divorced female......... Tax Optometrist by trade,, who comes in today for follow-up of hypertension diabetes obesity the classic metabolic syndrome  Her weight is unchanged 177 pounds BMI 33.70.  She saw her endocrinologist recently. The decreased her Glucotrol to 5 mg twice a day because of hypoglycemia. Occasionally her blood sugar behind the morning and she'll take 10 mg of Glucotrol. She continues to metformin 1000 mg twice a day in the Invokana 100 mg daily. She takes Zocor and aspirin for hyperlipidemia. She's on Hyzaar and Corgard because of hypertension. BP today 108/72  Since tax season is quieted down her migraines have diminished  She's having trouble with ingrown toenails on both great toes. There is sore but not infected.  BP 108/72 (BP Location: Right Arm)   Temp 98.3 F (36.8 C) (Oral)   Wt 177 lb 8 oz (80.5 kg)   BMI 33.54 kg/m  General she is a well-developed well-nourished female no acute distress examination is the feet shows very small ingrown toenails bilaterally. After alcohol prep the nails were trimmed by me. She was advised to do this monthly when necessary at home after hot soaks. Also use antibiotic ointment and a Band-Aid and be cognizant of any evidence of infection.  #1 diabetes type 2 A1c recently 6.7%......... continue current treatment program  #2 hypertension at goal....... continue current treatment program  #3 overweight........... again discussed diet exercise and weight loss  Number for hyperlipidemia,,,,,,,,,, continue Zocor and aspirin  #5 bilateral small ingrown toenails without infection,,,,,,,,, nails were trimmed by me as above

## 2016-09-28 ENCOUNTER — Other Ambulatory Visit: Payer: Self-pay | Admitting: Internal Medicine

## 2016-11-27 ENCOUNTER — Ambulatory Visit (INDEPENDENT_AMBULATORY_CARE_PROVIDER_SITE_OTHER): Payer: BLUE CROSS/BLUE SHIELD | Admitting: Internal Medicine

## 2016-11-27 ENCOUNTER — Encounter: Payer: Self-pay | Admitting: Internal Medicine

## 2016-11-27 VITALS — BP 124/82 | HR 68 | Ht 62.0 in | Wt 177.0 lb

## 2016-11-27 DIAGNOSIS — Z6832 Body mass index (BMI) 32.0-32.9, adult: Secondary | ICD-10-CM

## 2016-11-27 DIAGNOSIS — Z23 Encounter for immunization: Secondary | ICD-10-CM | POA: Diagnosis not present

## 2016-11-27 DIAGNOSIS — E1165 Type 2 diabetes mellitus with hyperglycemia: Secondary | ICD-10-CM | POA: Diagnosis not present

## 2016-11-27 DIAGNOSIS — E669 Obesity, unspecified: Secondary | ICD-10-CM | POA: Diagnosis not present

## 2016-11-27 LAB — POCT GLYCOSYLATED HEMOGLOBIN (HGB A1C): Hemoglobin A1C: 6.1

## 2016-11-27 NOTE — Patient Instructions (Addendum)
Please continue: - Metformin at night (2000 mg). - Invokana 100 mg daily before b'fast. - Glipizide 5 mg 2x a day (skip Glipizide dose with dinner if you eat no carbs then)  Please return in 6 months with your sugar log.

## 2016-11-27 NOTE — Progress Notes (Signed)
Patient ID: Monica Cunningham, female   DOB: [birth year], 56 y.o.   MRN: [id number]   HPI: Monica Cunningham is a 56 y.o.-year-old female, returning for f/u for DM2, dx as GDM in 2003, then DM2 in 2005, non-insulin-dependent, uncontrolled, without long term complications. Last visit 3 mo ago.  She fell off the wagon with diet >> will get back on track. Still walking - 6/7 days: 4 mi.  Last hemoglobin A1c was: Lab Results  Component Value Date   HGBA1C 6.7 (H) 08/21/2016   HGBA1C 7.6 04/26/2016   HGBA1C 6.6 (H) 01/03/2016   Pt is on a regimen of: - Metformin at night (2000 mg). - Glipizide 10 mg 2x a day 15 min before a meal, but skips if no carbs with dinner - Invokana 100 mg daily before b'fast - started 04/2016  Pt checks her sugars 2x a day: - am: 165, 170-220 (ave 191) >> 92-132 >> 90s,100-136, 161 - 2h after b'fast: 161-191 >> 82, 119, 162 >> 130-140s, 150s - before lunch: 167-219 >> 56 x1, 87-130 >> 130s - 2h after lunch: n/c175-196 >> 97-173 > > as above - before dinner: 126-187 >> 86-144 >> as above - 2h after dinner: 129-199 >> 123-174 >> 111-130 - bedtime: 132-181, 243 >> 74-223 >> see above - nighttime: n/c No lows. Lowest sugar was 46 after exercise >> 56 >> 90; she has hypoglycemia awareness in the 70s.  Highest sugar was 358 - candy - before starting Glipizide >> 200s >> 161  Glucometer: Bayer Contour  Pt's meals are: - Breakfast: chicken tenders and PB; 1 egg; prev. muffins - Lunch: meat + veggies or salad - Dinner: protein, veggies, pizza; hamburger - Snacks: 3-4, diet soda, unsweet tea  She is walking in the treadmill, outside.  - No CKD, last BUN/creatinine:  Lab Results  Component Value Date   BUN 22 08/21/2016   BUN 18 06/15/2016   CREATININE 0.94 08/21/2016   CREATININE 0.78 06/15/2016  On Losartan. - She has HL. Last set of lipids: Lab Results  Component Value Date   CHOL 157 01/03/2016   HDL 39.00 (L) 01/03/2016   LDLCALC 85 01/03/2016   LDLDIRECT  120.9 04/15/2012   TRIG 163.0 (H) 01/03/2016   CHOLHDL 4 01/03/2016  On Zocor - last eye exam was in 03/2014 >> No DR. Did not schedule a new one yet. - She denies numbness and tingling in her feet.  She also has a h/o HTN, HL, PCOS, HAs (on Nadolol) - no HAs recently.  ROS: Constitutional: no weight gain/no weight loss, no fatigue, no subjective hyperthermia, no subjective hypothermia Eyes: no blurry vision, no xerophthalmia ENT: no sore throat, no nodules palpated in throat, no dysphagia, no odynophagia, no hoarseness Cardiovascular: no CP/no SOB/no palpitations/no leg swelling Respiratory: no cough/no SOB/no wheezing Gastrointestinal: no N/no V/no D/no C/no acid reflux Musculoskeletal: no muscle aches/no joint aches Skin: no rashes, no hair loss Neurological: no tremors/no numbness/no tingling/no dizziness  I reviewed pt's medications, allergies, PMH, social hx, family hx, and changes were documented in the history of present illness. Otherwise, unchanged from my initial visit note.   Past Medical History:  Diagnosis Date  . ALLERGIC RHINITIS 09/15/2007  . DM (diabetes mellitus) ([place])   . Endometriosis   . Essential hypertension, benign 01/26/2007  . Fibroid   . HYPERLIPIDEMIA 09/15/2007  . Insomnia   . Obesity   . PCOS (polycystic ovarian syndrome)   . POLYCYSTIC OVARIAN DISEASE 09/15/2007  . Rosacea  Past Surgical History:  Procedure Laterality Date  . ABDOMINAL HYSTERECTOMY    . CESAREAN SECTION     x2  . CHOLECYSTECTOMY    . COLONOSCOPY  01/21/2009   normal  . PELVIC LAPAROSCOPY    . REDUCTION MAMMAPLASTY    . REFRACTIVE SURGERY    . TUBAL LIGATION    . tummy tuck     Social History   Social History  . Marital status: Divorced    Spouse name: N/A  . Number of children: 3   Occupational History  . Accountant    Social History Main Topics  . Smoking status: Former Smoker    Packs/day: 0.25    Quit date: 2013  . Smokeless tobacco: Never Used  .  Alcohol use     1-2 Glasses of wine per week       . Drug use: No  . Sexual activity: Yes    Birth control/ protection: Surgical   Current Outpatient Prescriptions on File Prior to Visit  Medication Sig Dispense Refill  . clonazePAM (KLONOPIN) 1 MG tablet TAKE ONE TABLET BY MOUTH ONCE DAILY AS NEEDED 90 tablet 3  . fluconazole (DIFLUCAN) 100 MG tablet Take 1 tablet (100 mg total) by mouth daily. (Patient not taking: Reported on 08/29/2016) 3 tablet 3  . glipiZIDE (GLUCOTROL) 5 MG tablet Take 1 tablet (5 mg total) by mouth 2 (two) times daily before a meal. 180 tablet 3  . glucose blood (BAYER CONTOUR NEXT TEST) test strip USE UP TO 8x a day 400 each 3  . INVOKANA 100 MG TABS tablet TAKE ONE TABLET BY MOUTH ONCE DAILY BEFORE BREAKFAST 30 tablet 5  . losartan-hydrochlorothiazide (HYZAAR) 100-25 MG tablet TAKE ONE TABLET BY MOUTH once DAILY 90 tablet 3  . metFORMIN (GLUCOPHAGE) 1000 MG tablet Take 1 tablet (1,000 mg total) by mouth 2 (two) times daily. 180 tablet 3  . metroNIDAZOLE (METROGEL) 1 % gel Apply topically daily. 45 g 10  . montelukast (SINGULAIR) 10 MG tablet Take 1 tablet (10 mg total) by mouth daily. 90 tablet 3  . simvastatin (ZOCOR) 40 MG tablet Take 1 tablet (40 mg total) by mouth at bedtime. 90 tablet 3  . Sulfacetamide Sodium-Sulfur 10-5 % EMUL Apply small amounts once daily 227 g 4  . terconazole (TERAZOL 3) 0.8 % vaginal cream Place 1 applicator vaginally at bedtime. (Patient not taking: Reported on 08/29/2016) 20 g 6  . zolmitriptan (ZOMIG ZMT) 2.5 MG disintegrating tablet Take 1 tablet (2.5 mg total) by mouth as needed for migraine. 10 tablet 10   No current facility-administered medications on file prior to visit.    Allergies  Allergen Reactions  . Codeine     REACTION: nausea   Family History  Problem Relation Age of Onset  . Hypertension Mother   . Diabetes Mother   . Colon polyps Father   . Hypertension Father   . Heart disease Father   . Heart disease  Maternal Grandmother   . Diabetes Sister        gestational  . Hypertension Sister   . Hypertension Brother   . Heart disease Maternal Grandfather   . Cancer Paternal Grandmother        Kidney cancer  . Kidney cancer Paternal Grandmother     PE: BP 124/82 (BP Location: Left Arm, Patient Position: Sitting)   Pulse 68   Ht 5\' 2"  (1.575 m)   Wt 177 lb (80.3 kg)   SpO2 95%   BMI  32.37 kg/m   Wt Readings from Last 3 Encounters:  11/27/16 177 lb (80.3 kg)  08/29/16 177 lb 8 oz (80.5 kg)  06/15/16 175 lb (79.4 kg)   Constitutional: overweight, in NAD Eyes: PERRLA, EOMI, no exophthalmos ENT: moist mucous membranes, no thyromegaly, no cervical lymphadenopathy Cardiovascular: RRR, No MRG Respiratory: CTA B Gastrointestinal: abdomen soft, NT, ND, BS+ Musculoskeletal: no deformities, strength intact in all 4 Skin: moist, warm, no rashes Neurological: no tremor with outstretched hands, DTR normal in all 4  ASSESSMENT: 1. DM2, non-insulin-dependent, uncontrolled, without long term complications, but with hyperglycemia  2. Obesity class 1 - BMI 32.37 BMI Classification:  < 18.5 underweight   18.5-24.9 normal weight   25.0-29.9 overweight   30.0-34.9 class I obesity   35.0-39.9 class II obesity   ? 40.0 class III obesity   PLAN:  1. Patient with long-standing, Previously uncontrolled diabetes, with much improved control after addition of iNVOKANA, and after she started to improve her diet and exercise. Since last visit, though, she did not lose any more weight, but her diabetes continued to improve >> today, HbA1c is 6.1% (great!) - Sugars at home are slightly higher in the morning, but still at or very close to goal  -  she has had no lows since last visit, after we decreased Glipizide dose - no changes are needed in her regimen for now. - I suggested to:  Patient Instructions  Please continue: - Metformin at night (2000 mg). - Invokana 100 mg daily before b'fast. -  Glipizide 5 mg 2x a day (skip Glipizide dose with dinner if you eat no carbs then)  Please return in 4 months with your sugar log. [place]  - continue checking sugars at different times of the day - check 1x a day, rotating checks - advised for yearly eye exams >> she needs one - she was given the flu shot today - Return to clinic in 6 mo with sugar log - per her preference   2. Obesity class 1 - weight did not decrease more since last visit  - she will get back on her diet - conrinues to walk daily  Philemon Kingdom, MD PhD [facility] Endocrinology

## 2016-11-28 ENCOUNTER — Other Ambulatory Visit: Payer: Self-pay | Admitting: Family Medicine

## 2016-11-28 ENCOUNTER — Encounter: Payer: Self-pay | Admitting: Internal Medicine

## 2016-11-28 DIAGNOSIS — E119 Type 2 diabetes mellitus without complications: Secondary | ICD-10-CM

## 2016-11-29 ENCOUNTER — Other Ambulatory Visit: Payer: Self-pay | Admitting: Family Medicine

## 2016-11-29 DIAGNOSIS — Z1231 Encounter for screening mammogram for malignant neoplasm of breast: Secondary | ICD-10-CM

## 2016-12-04 DIAGNOSIS — L814 Other melanin hyperpigmentation: Secondary | ICD-10-CM | POA: Diagnosis not present

## 2016-12-04 DIAGNOSIS — L218 Other seborrheic dermatitis: Secondary | ICD-10-CM | POA: Diagnosis not present

## 2016-12-04 DIAGNOSIS — D1801 Hemangioma of skin and subcutaneous tissue: Secondary | ICD-10-CM | POA: Diagnosis not present

## 2016-12-04 DIAGNOSIS — L718 Other rosacea: Secondary | ICD-10-CM | POA: Diagnosis not present

## 2016-12-11 ENCOUNTER — Ambulatory Visit
Admission: RE | Admit: 2016-12-11 | Discharge: 2016-12-11 | Disposition: A | Discharge status: Home / Self Care | Payer: BLUE CROSS/BLUE SHIELD | Source: Ambulatory Visit | Attending: Family Medicine | Admitting: Family Medicine

## 2016-12-11 DIAGNOSIS — Z1231 Encounter for screening mammogram for malignant neoplasm of breast: Secondary | ICD-10-CM

## 2016-12-21 ENCOUNTER — Encounter: Payer: Self-pay | Admitting: Family Medicine

## 2017-01-15 ENCOUNTER — Ambulatory Visit (INDEPENDENT_AMBULATORY_CARE_PROVIDER_SITE_OTHER): Payer: BLUE CROSS/BLUE SHIELD | Admitting: Family Medicine

## 2017-01-15 ENCOUNTER — Encounter: Payer: Self-pay | Admitting: Family Medicine

## 2017-01-15 VITALS — BP 120/88 | HR 70 | Temp 98.3°F | Ht 61.0 in | Wt 174.0 lb

## 2017-01-15 DIAGNOSIS — L719 Rosacea, unspecified: Secondary | ICD-10-CM

## 2017-01-15 DIAGNOSIS — E785 Hyperlipidemia, unspecified: Secondary | ICD-10-CM

## 2017-01-15 DIAGNOSIS — N760 Acute vaginitis: Secondary | ICD-10-CM | POA: Diagnosis not present

## 2017-01-15 DIAGNOSIS — I10 Essential (primary) hypertension: Secondary | ICD-10-CM

## 2017-01-15 DIAGNOSIS — E1165 Type 2 diabetes mellitus with hyperglycemia: Secondary | ICD-10-CM | POA: Diagnosis not present

## 2017-01-15 LAB — HEPATIC FUNCTION PANEL
ALK PHOS: 58 U/L (ref 39–117)
ALT: 37 U/L — ABNORMAL HIGH (ref 0–35)
AST: 25 U/L (ref 0–37)
Albumin: 4.7 g/dL (ref 3.5–5.2)
BILIRUBIN DIRECT: 0.3 mg/dL (ref 0.0–0.3)
BILIRUBIN TOTAL: 2 mg/dL — AB (ref 0.2–1.2)
TOTAL PROTEIN: 7.1 g/dL (ref 6.0–8.3)

## 2017-01-15 LAB — POCT URINALYSIS DIPSTICK
Bilirubin, UA: NEGATIVE
Ketones, UA: NEGATIVE
LEUKOCYTES UA: NEGATIVE
NITRITE UA: NEGATIVE
PH UA: 6 (ref 5.0–8.0)
PROTEIN UA: NEGATIVE
RBC UA: NEGATIVE
Spec Grav, UA: 1.025 (ref 1.010–1.025)
UROBILINOGEN UA: 0.2 U/dL

## 2017-01-15 LAB — CBC WITH DIFFERENTIAL/PLATELET
BASOS ABS: 0.1 10*3/uL (ref 0.0–0.1)
Basophils Relative: 1 % (ref 0.0–3.0)
EOS ABS: 0.2 10*3/uL (ref 0.0–0.7)
Eosinophils Relative: 2.5 % (ref 0.0–5.0)
HEMATOCRIT: 48.3 % — AB (ref 36.0–46.0)
HEMOGLOBIN: 16.3 g/dL — AB (ref 12.0–15.0)
LYMPHS PCT: 35.4 % (ref 12.0–46.0)
Lymphs Abs: 2.3 10*3/uL (ref 0.7–4.0)
MCHC: 33.7 g/dL (ref 30.0–36.0)
MCV: 96.5 fl (ref 78.0–100.0)
MONOS PCT: 8.9 % (ref 3.0–12.0)
Monocytes Absolute: 0.6 10*3/uL (ref 0.1–1.0)
NEUTROS ABS: 3.3 10*3/uL (ref 1.4–7.7)
Neutrophils Relative %: 52.2 % (ref 43.0–77.0)
PLATELETS: 180 10*3/uL (ref 150.0–400.0)
RBC: 5.01 Mil/uL (ref 3.87–5.11)
RDW: 13.4 % (ref 11.5–15.5)
WBC: 6.4 10*3/uL (ref 4.0–10.5)

## 2017-01-15 LAB — LIPID PANEL
CHOL/HDL RATIO: 4
Cholesterol: 181 mg/dL (ref 0–200)
HDL: 47.2 mg/dL (ref >39.00)
LDL CALC: 98 mg/dL (ref 0–99)
NONHDL: 133.64
Triglycerides: 178 mg/dL — ABNORMAL HIGH (ref 0.0–149.0)
VLDL: 35.6 mg/dL (ref 0.0–40.0)

## 2017-01-15 LAB — BASIC METABOLIC PANEL
BUN: 20 mg/dL (ref 6–23)
CHLORIDE: 101 meq/L (ref 96–112)
CO2: 28 meq/L (ref 19–32)
Calcium: 10.2 mg/dL (ref 8.4–10.5)
Creatinine, Ser: 0.88 mg/dL (ref 0.40–1.20)
GFR: 71.22 mL/min (ref >60.00)
GLUCOSE: 113 mg/dL — AB (ref 70–99)
POTASSIUM: 3.9 meq/L (ref 3.5–5.1)
SODIUM: 140 meq/L (ref 135–145)

## 2017-01-15 LAB — TSH: TSH: 1.97 u[IU]/mL (ref 0.35–4.50)

## 2017-01-15 MED ORDER — CLONAZEPAM 1 MG PO TABS: ORAL_TABLET | ORAL | 3 refills | Status: DC

## 2017-01-15 MED ORDER — ESTROGENS CONJUGATED 0.3 MG PO TABS: 0.3000 mg | ORAL_TABLET | Freq: Every day | ORAL | 4 refills | Status: DC

## 2017-01-15 MED ORDER — MONTELUKAST SODIUM 10 MG PO TABS: 10.0000 mg | ORAL_TABLET | Freq: Every day | ORAL | 3 refills | Status: DC

## 2017-01-15 MED ORDER — LOSARTAN POTASSIUM-HCTZ 100-25 MG PO TABS
ORAL_TABLET | ORAL | 4 refills | Status: DC
Start: 2017-01-15 — End: 2018-01-21

## 2017-01-15 MED ORDER — FLUCONAZOLE 100 MG PO TABS: 100.0000 mg | ORAL_TABLET | Freq: Every day | ORAL | 3 refills | Status: DC

## 2017-01-15 MED ORDER — SULFACETAMIDE SODIUM-SULFUR 10-5 % EX EMUL: CUTANEOUS | 4 refills | Status: AC

## 2017-01-15 MED ORDER — TERCONAZOLE 0.8 % VA CREA: 1.0000 | TOPICAL_CREAM | Freq: Every day | VAGINAL | 6 refills | Status: DC

## 2017-01-15 MED ORDER — SIMVASTATIN 40 MG PO TABS
40.0000 mg | ORAL_TABLET | Freq: Every day | ORAL | 4 refills | Status: DC
Start: 2017-01-15 — End: 2017-05-28

## 2017-01-15 NOTE — Progress Notes (Signed)
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 Lorie Apley. She takes Invokana 100 mg daily, Glucotrol 5 mg in the morning and 10 mg before evening meal, metformin 1000 mg twice a day. Last A1c in August was 6.1%  She takes Klonopin 1 mg at bedtime for sleep 5 out of 7 nights  She has recurrent fungal vaginitis which she takes Ticlid: And Terazol when necessary  She uses Hyzaar 100 mg-25 daily for hypertension. BP today is 120/88  She takes Singulair for allergic rhinitis  She takes Zocor 40 mg daily along with an aspirin tablet because a history of hyperlipidemia.  She does not need a refill of the Zomig. Her migraines have stopped.  She gets routine dental care. She skipped dry exam this year. Recommend she get annual eye exam. She does get an annual mammography colonoscopy was 2010 normal.  Vaccinations up-to-date.  Social history she is divorced. 2 children. 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  She has a sensation in her lower back to comes and goes. It's not pain it's a "clicking sensation".  She has once or twice a week as an episode of rapid heart rate. It may be associated with increased caffeine consumption when she is working hard at her job as an Optometrist. Advised to back off on the caffeine.  She had her uterus removed at age 22 for dysfunctional uterine bleeding. Ovaries were left intact. Ovarian function is diminishing over time..  14 point review of systems reviewed and otherwise negative except for above  BP 120/88 (BP Location: Left Arm, Patient Position: Sitting, Cuff Size: Normal)   Pulse 70   Temp 98.3 F (36.8 C) (Oral)   Ht 5\' 1"  (1.549 m)   Wt 174 lb (78.9 kg)   BMI 32.88 kg/m  She's  well-developed well-nourished slightly overweight female no acute distress BMI is 32.37 obese class I.  Examination of the HEENT were negative neck was supple thyroid is not enlarged no carotid bruits cardiopulmonary exam normal breast exam shows scars from previous reduction mammoplasty. No other abnormalities except she has to moles that are red and irritated on anterior chest wall. We will remove those today.  Abdominal exam negative pelvic and hip not indicated rectal not indicated extremities normal skin no peripheral pulses normal no neuropathy  #1 diabetes type 2 at goal....... continue current therapy  #2 hypertension at goal.......... continue current therapy  #3 hyperlipidemia at goal....... check labs.... Continue Zocor and baby aspirin  #4 perimenopausal.......... Premarin 0.3 when necessary  #5 overweight......... again as we have in the past discussed diet exercise and weight loss.  #6 episodes of PAT.... Short lived....... avoid caffeine...Marland KitchenMarland KitchenMarland Kitchen return if symptomatic off caffeine.

## 2017-01-15 NOTE — Patient Instructions (Signed)
Continue current medications  Walk 30 minutes daily. Continue your weight loss........... your down 3 pounds from last year  If the rapid heart rate becomes problematic go on a complete caffeine free diet. If you go off caffeine and still having episodes of rapid heart rate and let us know.  Premarin 0.3..........Marland Kitchen 1 daily as needed for severe menopausal symptoms  Get your annual eye exam this fall  Return to see me in one year for general physical exam sooner if any problems

## 2017-01-16 LAB — MICROALBUMIN / CREATININE URINE RATIO
Creatinine,U: 111 mg/dL
MICROALB/CREAT RATIO: 3.3 mg/g (ref 0.0–30.0)
Microalb, Ur: 3.7 mg/dL — ABNORMAL HIGH (ref 0.0–1.9)

## 2017-02-26 ENCOUNTER — Other Ambulatory Visit: Payer: Self-pay | Admitting: Family Medicine

## 2017-04-04 ENCOUNTER — Other Ambulatory Visit: Payer: Self-pay | Admitting: Internal Medicine

## 2017-04-24 ENCOUNTER — Other Ambulatory Visit: Payer: Self-pay | Admitting: Internal Medicine

## 2017-05-28 ENCOUNTER — Encounter: Payer: Self-pay | Admitting: Internal Medicine

## 2017-05-28 ENCOUNTER — Ambulatory Visit: Payer: BLUE CROSS/BLUE SHIELD | Admitting: Internal Medicine

## 2017-05-28 VITALS — BP 118/74 | HR 76 | Ht 61.0 in | Wt 176.2 lb

## 2017-05-28 DIAGNOSIS — E1165 Type 2 diabetes mellitus with hyperglycemia: Secondary | ICD-10-CM

## 2017-05-28 DIAGNOSIS — E785 Hyperlipidemia, unspecified: Secondary | ICD-10-CM

## 2017-05-28 DIAGNOSIS — R300 Dysuria: Secondary | ICD-10-CM

## 2017-05-28 LAB — POCT GLYCOSYLATED HEMOGLOBIN (HGB A1C): HEMOGLOBIN A1C: 6.9

## 2017-05-28 NOTE — Progress Notes (Addendum)
Patient ID: ZETA BUCY, female   DOB: December 06, 1960, 57 y.o.   MRN: [id number]   HPI: Monica Cunningham is a 57 y.o.-year-old female, returning for f/u for DM2, dx as GDM in 2003, then DM2 in 2005, non-insulin-dependent, uncontrolled, without long term complications. Last visit 6 months ago.  She had a recent URI and gastroenteritis.  She has a UTI.  Last hemoglobin A1c was: Lab Results  Component Value Date   HGBA1C 6.1 11/27/2016   HGBA1C 6.7 (H) 08/21/2016   HGBA1C 7.6 04/26/2016   Pt is on a regimen of: - Metformin 2000 mg with dinner - Invokana 100 mg daily before b'fast -started 04/2016. - Glipizide 5 mg 2x a day (skip Glipizide dose with dinner if you eat no carbs then) >> 1x a day in am now  Pt checks her sugars twice a day: - am: 92-132 >> 90s,100-136, 161 >> 127-156 - 2h after b'fast:82, 119, 162 >> 130-140s, 150s >> n/c - before lunch:  56 x1, 87-130 >> 130s >> 120-130 - 2h after lunch:175-196 >> 97-173 > > as above >> 200s Christmas - before dinner: 126-187 >> 86-144 >> as above - 2h after dinner: 129-199 >> 123-174 >> 111-130 >> 110-130s, 170 or 190s Christmas - bedtime: 132-181, 243 >> 74-223 >> see above - nighttime: n/c Lowest sugar was 90 >> 120s; she has hypoglycemia awareness in the 70s.  Highest sugar was 161 >> 200  Glucometer: Bayer Contour  Pt's meals are: - Breakfast: chicken tenders and PB; 1 egg; prev. muffins - Lunch: meat + veggies or salad - Dinner: protein, veggies, pizza; hamburger - Snacks: 3-4, diet soda, unsweet tea  She is walking but less now.  - No CKD, last BUN/creatinine:  Lab Results  Component Value Date   BUN 20 01/15/2017   BUN 22 08/21/2016   CREATININE 0.88 01/15/2017   CREATININE 0.94 08/21/2016  On Losartan. - + HL. Last set of lipids: Lab Results  Component Value Date   CHOL 181 01/15/2017   HDL 47.20 01/15/2017   LDLCALC 98 01/15/2017   LDLDIRECT 120.9 04/15/2012   TRIG 178.0 (H) 01/15/2017   CHOLHDL 4 01/15/2017   On Zocor. - last eye exam was in 03/2014 >> No DR. Did not schedule a new exam despite multiple promptings. - no numbness and tingling in her feet.  She also has a h/o HTN, PCOS, headaches- on nadolol.  ROS: Constitutional: no weight gain/no weight loss, no fatigue, no subjective hyperthermia, no subjective hypothermia, + dysuria Eyes: no blurry vision, no xerophthalmia ENT: no sore throat, no nodules palpated in throat, no dysphagia, no odynophagia, no hoarseness Cardiovascular: no CP/no SOB/no palpitations/no leg swelling Respiratory: no cough/no SOB/no wheezing Gastrointestinal: no N/no V/no D/no C/no acid reflux Musculoskeletal: no muscle aches/no joint aches Skin: no rashes, no hair loss Neurological: no tremors/no numbness/no tingling/no dizziness  I reviewed pt's medications, allergies, PMH, social hx, family hx, and changes were documented in the history of present illness. Otherwise, unchanged from my initial visit note.   Past Medical History:  Diagnosis Date  . ALLERGIC RHINITIS 09/15/2007  . DM (diabetes mellitus) ([place])   . Endometriosis   . Essential hypertension, benign 01/26/2007  . Fibroid   . HYPERLIPIDEMIA 09/15/2007  . Insomnia   . Obesity   . PCOS (polycystic ovarian syndrome)   . POLYCYSTIC OVARIAN DISEASE 09/15/2007  . Rosacea    Past Surgical History:  Procedure Laterality Date  . ABDOMINAL HYSTERECTOMY    . CESAREAN  SECTION     x2  . CHOLECYSTECTOMY    . COLONOSCOPY  01/21/2009   normal  . PELVIC LAPAROSCOPY    . REDUCTION MAMMAPLASTY Bilateral   . REFRACTIVE SURGERY    . TUBAL LIGATION    . tummy tuck     Social History   Social History  . Marital status: Divorced    Spouse name: N/A  . Number of children: 3   Occupational History  . Accountant    Social History Main Topics  . Smoking status: Former Smoker    Packs/day: 0.25    Quit date: 2013  . Smokeless tobacco: Never Used  . Alcohol use     1-2 Glasses of wine per week        . Drug use: No  . Sexual activity: Yes    Birth control/ protection: Surgical   Current Outpatient Medications on File Prior to Visit  Medication Sig Dispense Refill  . clonazePAM (KLONOPIN) 1 MG tablet TAKE ONE TABLET BY MOUTH ONCE DAILY AS NEEDED 90 tablet 3  . estrogens, conjugated, (PREMARIN) 0.3 MG tablet Take 1 tablet (0.3 mg total) by mouth daily. Take daily for 21 days then do not take for 7 days. 100 tablet 4  . fluconazole (DIFLUCAN) 100 MG tablet Take 1 tablet (100 mg total) by mouth daily. 3 tablet 3  . glipiZIDE (GLUCOTROL) 5 MG tablet TAKE 1 TABLET BY MOUTH TWICE DAILY BEFORE A MEAL 180 tablet 3  . glucose blood (BAYER CONTOUR NEXT TEST) test strip USE UP TO 8x a day 400 each 3  . INVOKANA 100 MG TABS tablet TAKE 1 TABLET BY MOUTH ONCE DAILY BEFORE BREAKFAST 30 tablet 5  . losartan-hydrochlorothiazide (HYZAAR) 100-25 MG tablet TAKE ONE TABLET BY MOUTH once DAILY 90 tablet 4  . metFORMIN (GLUCOPHAGE) 1000 MG tablet TAKE ONE TABLET BY MOUTH TWICE DAILY 180 tablet 3  . metroNIDAZOLE (METROGEL) 1 % gel Apply topically daily. 45 g 10  . montelukast (SINGULAIR) 10 MG tablet Take 1 tablet (10 mg total) by mouth daily. 90 tablet 3  . simvastatin (ZOCOR) 40 MG tablet Take 1 tablet (40 mg total) by mouth at bedtime. 90 tablet 3  . simvastatin (ZOCOR) 40 MG tablet Take 1 tablet (40 mg total) by mouth at bedtime. 90 tablet 4  . Sulfacetamide Sodium-Sulfur 10-5 % EMUL Apply small amounts once daily 227 g 4  . terconazole (TERAZOL 3) 0.8 % vaginal cream Place 1 applicator vaginally at bedtime. 20 g 6   No current facility-administered medications on file prior to visit.    Allergies  Allergen Reactions  . Codeine     REACTION: nausea   Family History  Problem Relation Age of Onset  . Hypertension Mother   . Diabetes Mother   . Colon polyps Father   . Hypertension Father   . Heart disease Father   . Heart disease Maternal Grandmother   . Diabetes Sister        gestational  .  Hypertension Sister   . Hypertension Brother   . Heart disease Maternal Grandfather   . Cancer Paternal Grandmother        Kidney cancer  . Kidney cancer Paternal Grandmother   . Breast cancer Neg Hx     PE: BP 118/74   Pulse 76   Ht 5\' 1"  (1.549 m)   Wt 176 lb 3.2 oz (79.9 kg)   SpO2 98%   BMI 33.29 kg/m  Wt Readings from Last 3  Encounters:  05/28/17 176 lb 3.2 oz (79.9 kg)  01/15/17 174 lb (78.9 kg)  11/27/16 177 lb (80.3 kg)   Constitutional: overweight, in NAD Eyes: PERRLA, EOMI, no exophthalmos ENT: moist mucous membranes, no thyromegaly, no cervical lymphadenopathy Cardiovascular: RRR, No MRG Respiratory: CTA B Gastrointestinal: abdomen soft, NT, ND, BS+ Musculoskeletal: no deformities, strength intact in all 4 Skin: moist, warm, no rashes Neurological: no tremor with outstretched hands, DTR normal in all 4  ASSESSMENT: 1. DM2, non-insulin-dependent, uncontrolled, without long term complications, but with hyperglycemia  2. Dysuria  3. HL  PLAN:  1. Patient with long-standing, previously uncontrolled diabetes, with much better control after addition of Invokana, and after she started to improve her diet and activity.  At last visit, HbA1c was 6.1% (improved).  Her sugars were slightly higher in the morning but still at or very close to goal.  She has had low blood sugars in the past, but no lows after we decreased the glipizide dose.  We did not change her regimen at last visit. - at this visit, she reduced the Glipizide to 5 mg in am only as she was walking before and sugars dropped afterwards. She then stopped walking >> sugars higher esp. In am >> restart Glipizide before dinner. - I suggested to:  Patient Instructions  Please continue: - Metformin 2000 mg with dinner - Invokana 100 mg daily before b'fast.  Please increase: - Glipizide to 5 mg 2x a day   If the urine does not contain bacteria, please start Terconazole Cream 3.2%: - insert 1 applicatorful  vaginally at bedtime every night for 1 week, then - insert 1/2 applicatorful at bedtime once weekly indefinitely to maintain effect  Please stop at the lab.  Please return in 6 months with your sugar log.   - today, HbA1c is 6.9% (higher) - continue checking sugars at different times of the day - check 1x a day, rotating checks - advised for yearly eye exams >> she is UTD - Return to clinic in 6 mo (per her preference) with sugar log    2. Dysuria - check U/A - if no bacteria >> restart terconazole for poss. Yeast inf.  3. HL - reviewed latest lipid panel from 12/2016 >> LDL improved, now <100; TG slightly high - continues Zocor w/o SEs  Office Visit on 05/28/2017  Component Date Value Ref Range Status  . Hemoglobin A1C 05/28/2017 6.9   Final  . Color, Urine 05/28/2017 YELLOW  YELLOW Final  . APPearance 05/28/2017 CLEAR  CLEAR Final  . Specific Gravity, Urine 05/28/2017 1.028  1.001 - 1.03 Final  . pH 05/28/2017 5.5  5.0 - 8.0 Final  . Glucose, UA 05/28/2017 3+* NEGATIVE Final  . Bilirubin Urine 05/28/2017 NEGATIVE  NEGATIVE Final  . Ketones, ur 05/28/2017 NEGATIVE  NEGATIVE Final  . Hgb urine dipstick 05/28/2017 NEGATIVE  NEGATIVE Final  . Protein, ur 05/28/2017 NEGATIVE  NEGATIVE Final  . Nitrites, Initial 05/28/2017 POSITIVE* NEGATIVE Final  . Leukocyte Esterase 05/28/2017 NEGATIVE  NEGATIVE Final  . WBC, UA 05/28/2017 6-10* 0 - 5 /HPF Final  . RBC / HPF 05/28/2017 0-2  0 - 2 /HPF Final  . Squamous Epithelial / LPF 05/28/2017 0-5  < OR = 5 /HPF Final  . Bacteria, UA 05/28/2017 MANY* NONE SEEN /HPF Final  . Hyaline Cast 05/28/2017 NONE SEEN  NONE SEEN /LPF Final  . REFLEXIVE URINE CULTURE 05/28/2017 CULTURE INDICATED - RESULTS TO FOLLOW   Final  . MICRO NUMBER: 05/28/2017  [id number]   Preliminary  . SPECIMEN QUALITY: 05/28/2017 ADEQUATE   Preliminary  . Sample Source 05/28/2017 URINE   Preliminary  . STATUS: 05/28/2017 PRELIMINARY   Preliminary  . ISOLATE 1: 05/28/2017  Gram negative bacilli isolated*  Preliminary   Greater than 100,000 CFU/mL of Gram negative bacilli isolated Identification and susceptibilities to follow.   UTI >> start Cipro x 5 days  Philemon Kingdom, MD PhD [facility] Endocrinology

## 2017-05-28 NOTE — Patient Instructions (Addendum)
Please continue: - Metformin 2000 mg with dinner - Invokana 100 mg daily before b'fast.  Please increase: - Glipizide to 5 mg 2x a day   If the urine does not contain bacteria, please start Terconazole Cream 7.8%: - insert 1 applicatorful vaginally at bedtime every night for 1 week, then - insert 1/2 applicatorful at bedtime once weekly indefinitely to maintain effect  Please stop at the lab.  Please return in 6 months with your sugar log.

## 2017-05-29 ENCOUNTER — Encounter: Payer: Self-pay | Admitting: Internal Medicine

## 2017-05-30 ENCOUNTER — Other Ambulatory Visit: Payer: Self-pay | Admitting: Internal Medicine

## 2017-05-30 LAB — URINALYSIS W MICROSCOPIC + REFLEX CULTURE
BILIRUBIN URINE: NEGATIVE
HGB URINE DIPSTICK: NEGATIVE
Hyaline Cast: NONE SEEN /LPF
KETONES UR: NEGATIVE
LEUKOCYTE ESTERASE: NEGATIVE
NITRITES URINE, INITIAL: POSITIVE — AB
Protein, ur: NEGATIVE
SPECIFIC GRAVITY, URINE: 1.028 (ref 1.001–1.03)
pH: 5.5 (ref 5.0–8.0)

## 2017-05-30 LAB — URINE CULTURE
MICRO NUMBER: 90255759
SPECIMEN QUALITY:: ADEQUATE

## 2017-05-30 LAB — CULTURE INDICATED

## 2017-05-30 MED ORDER — CIPROFLOXACIN HCL 500 MG PO TABS: 500.0000 mg | ORAL_TABLET | Freq: Two times a day (BID) | ORAL | 0 refills | Status: DC

## 2017-07-09 ENCOUNTER — Ambulatory Visit: Payer: BLUE CROSS/BLUE SHIELD | Admitting: Family Medicine

## 2017-07-09 ENCOUNTER — Encounter: Payer: Self-pay | Admitting: Family Medicine

## 2017-07-09 VITALS — BP 128/80 | HR 76 | Temp 98.2°F | Wt 176.0 lb

## 2017-07-09 DIAGNOSIS — R3915 Urgency of urination: Secondary | ICD-10-CM | POA: Diagnosis not present

## 2017-07-09 DIAGNOSIS — R81 Glycosuria: Secondary | ICD-10-CM | POA: Diagnosis not present

## 2017-07-09 LAB — POC URINALSYSI DIPSTICK (AUTOMATED)
BILIRUBIN UA: NEGATIVE
Blood, UA: NEGATIVE
Ketones, UA: NEGATIVE
LEUKOCYTES UA: NEGATIVE
NITRITE UA: NEGATIVE
PH UA: 6 (ref 5.0–8.0)
PROTEIN UA: NEGATIVE
Spec Grav, UA: 1.015 (ref 1.010–1.025)
Urobilinogen, UA: 0.2 E.U./dL

## 2017-07-09 NOTE — Progress Notes (Signed)
Monica Cunningham is a delightful 57 year old single female nonsmoker comes in today for evaluation of urinary tract symptoms for 2 months  This all started in January when she developed symptoms of urinary tract infection. She thought it may be a yeast infection therefore she treated herself with over-the-counter medication. Symptoms not resolve. She went to see Dr. Lorie Apley in February urine culture was positive for Klebsiella. She was given Cipro 500 twice a day for 5 days however symptoms never completely resolved. She then took another round of Diflucan and the Terazol cream taking it may be a fungal vaginitis. However again the symptoms are now resolved. If symptoms are tingling in the small sense of urgency. She has no fever chills back pain and hematuria etc. Etc.  Urinalysis here today shows 3+ sugar otherwise negative. Urine was sent for culture because her previous history of urinary tract infection.  BP 128/80 (BP Location: Left Arm, Patient Position: Sitting, Cuff Size: Normal)   Pulse 76   Temp 98.2 F (36.8 C) (Oral)   Wt 176 lb (79.8 kg)   BMI 33.25 kg/m  Well-developed well-nourished female no acute distress vital signs stable she's afebrile examination the abdomen was negative except for chronic scars from previous surgery. Pelvic examination external genitalia within normal limits vaginal vault was normal urethra appeared normal. Bimanual exam negative no palpable masses or tenderness.  Impression #1 urinary tract symptoms probably related to the elevated sugar in her urine from the [place]........ Urine sent for culture to be sure.........Marland Kitchen With this note to Dr. Lorie Apley I will also discuss this with her.  We pulled up the package insert on Invokana and indeed her symptoms are consistent with side effects from the medication. Since she is not having recurrent urinary tract infections I think I would continue with her current medication.

## 2017-07-09 NOTE — Patient Instructions (Signed)
We will send her urine for culture to be sure however since is no nitrate etc. I do not think it's infection.  I think her symptoms related to the elevation of the sugar content in your bladder as a side effect from Invokana.  When she get the culture back I will call you the report and we will discuss further options

## 2017-07-11 LAB — URINE CULTURE
MICRO NUMBER: 90436329
SPECIMEN QUALITY:: ADEQUATE

## 2017-07-15 ENCOUNTER — Other Ambulatory Visit: Payer: Self-pay | Admitting: Family Medicine

## 2017-07-15 MED ORDER — SULFAMETHOXAZOLE-TRIMETHOPRIM 800-160 MG PO TABS: ORAL_TABLET | ORAL | 1 refills | Status: DC

## 2017-08-22 ENCOUNTER — Other Ambulatory Visit: Payer: Self-pay | Admitting: Family Medicine

## 2017-08-22 ENCOUNTER — Other Ambulatory Visit: Payer: Self-pay | Admitting: Internal Medicine

## 2017-08-27 NOTE — Telephone Encounter (Signed)
Pt request refills

## 2017-09-26 ENCOUNTER — Other Ambulatory Visit: Payer: Self-pay | Admitting: Internal Medicine

## 2017-11-26 ENCOUNTER — Encounter: Payer: Self-pay | Admitting: Internal Medicine

## 2017-11-26 ENCOUNTER — Ambulatory Visit: Payer: BLUE CROSS/BLUE SHIELD | Admitting: Internal Medicine

## 2017-11-26 VITALS — BP 138/90 | HR 84 | Ht 61.0 in | Wt 178.4 lb

## 2017-11-26 DIAGNOSIS — E785 Hyperlipidemia, unspecified: Secondary | ICD-10-CM | POA: Diagnosis not present

## 2017-11-26 DIAGNOSIS — E1165 Type 2 diabetes mellitus with hyperglycemia: Secondary | ICD-10-CM | POA: Diagnosis not present

## 2017-11-26 LAB — POCT GLYCOSYLATED HEMOGLOBIN (HGB A1C): Hemoglobin A1C: 6.3 % — AB (ref 4.0–5.6)

## 2017-11-26 MED ORDER — METFORMIN HCL 1000 MG PO TABS: 1000.0000 mg | ORAL_TABLET | Freq: Two times a day (BID) | ORAL | 3 refills | Status: DC

## 2017-11-26 NOTE — Patient Instructions (Addendum)
Please continue: - Metformin 2000 mg with dinner - Invokana 100 mg daily before b'fast.  Please decrease: - Glipizide to 5 mg 2x a day  Please return in 6 months with your sugar log.

## 2017-11-26 NOTE — Progress Notes (Signed)
Patient ID: Monica Cunningham, female   DOB: [birth year], 57 y.o.   MRN: [id number]   HPI: Monica Cunningham is a 57 y.o.-year-old female, returning for f/u for DM2, dx as GDM in 2003, then DM2 in 2005, non-insulin-dependent, uncontrolled, without long term complications. Last visit 6 months ago.  Since last visit, she started a new job 4 weeks ago and does not have time to exercise as much as before.  Also, she did relaxing her diet a little.  She has more anxiety.  Last hemoglobin A1c was: Lab Results  Component Value Date   HGBA1C 6.9 05/28/2017   HGBA1C 6.1 11/27/2016   HGBA1C 6.7 (H) 08/21/2016   Pt is on a regimen of: - Metformin 2000 mg with dinner - Invokana  100 mg daily before b'fast -started 04/2016. - Glipizide 5 mg in am >> 5-10 mg 2x a day   Pt checks her sugars 2X a day: - am: 127-156 >> 123-182  - 2h after b'fast: 130-140s, 150s >> n/c  - before lunch: 130s >> 120-130 >> 93-142, 165 - 2h after lunch: as above >> 200s >> 187, 272 (reg.  Soda) - before dinner: 86-144 >> as above >> 95-127, 167 - 2h after dinner: 110-130s, 170 or 190s >> 60, 71, 83, 124, 150 - bedtime: 74-223 >> see above >> 42 - nighttime: n/c >> 51 x1, 60, 104-179 Lowest sugar was 90 >> 120s >> 42 at bedtime; she has hypoglycemia awareness in the 70s.  Highest sugar was 161 >> 200 >> 272  Glucometer: Bayer Contour  Pt's meals are: - Breakfast: chicken tenders and PB; 1 egg; prev. muffins - Lunch: meat + veggies or salad - Dinner: protein, veggies, pizza; hamburger - Snacks: 3-4, diet soda, unsweet tea  -No CKD, last BUN/creatinine:  Lab Results  Component Value Date   BUN 20 01/15/2017   BUN 22 08/21/2016   CREATININE 0.88 01/15/2017   CREATININE 0.94 08/21/2016  On losartan. -+ HL. Last set of lipids: Lab Results  Component Value Date   CHOL 181 01/15/2017   HDL 47.20 01/15/2017   LDLCALC 98 01/15/2017   LDLDIRECT 120.9 04/15/2012   TRIG 178.0 (H) 01/15/2017   CHOLHDL 4 01/15/2017  On  Zocor. - last eye exam was in 03/2014: No DR.  She did not schedule a new eye exam despite multiple promptings. - Denies numbness and tingling in her feet.  She also has a h/o HTN, PCOS, headaches- on nadolol.  She has a history of TAH.  ROS: Constitutional: no weight gain/no weight loss, no fatigue, + subjective hyperthermia, no subjective hypothermia Eyes: no blurry vision, no xerophthalmia ENT: no sore throat, no nodules palpated in throat, no dysphagia, no odynophagia, no hoarseness Cardiovascular: no CP/no SOB/+ palpitations/no leg swelling Respiratory: no cough/no SOB/no wheezing Gastrointestinal: no N/no V/no D/no C/no acid reflux Musculoskeletal: no muscle aches/no joint aches Skin: no rashes, no hair loss Neurological: no tremors/no numbness/no tingling/no dizziness  I reviewed pt's medications, allergies, PMH, social hx, family hx, and changes were documented in the history of present illness. Otherwise, unchanged from my initial visit note.   Past Medical History:  Diagnosis Date  . ALLERGIC RHINITIS 09/15/2007  . DM (diabetes mellitus) ([place])   . Endometriosis   . Essential hypertension, benign 01/26/2007  . Fibroid   . HYPERLIPIDEMIA 09/15/2007  . Insomnia   . Obesity   . PCOS (polycystic ovarian syndrome)   . POLYCYSTIC OVARIAN DISEASE 09/15/2007  . Rosacea  Past Surgical History:  Procedure Laterality Date  . ABDOMINAL HYSTERECTOMY    . CESAREAN SECTION     x2  . CHOLECYSTECTOMY    . COLONOSCOPY  01/21/2009   normal  . PELVIC LAPAROSCOPY    . REDUCTION MAMMAPLASTY Bilateral   . REFRACTIVE SURGERY    . TUBAL LIGATION    . tummy tuck     Social History   Social History  . Marital status: Divorced    Spouse name: N/A  . Number of children: 3   Occupational History  . Accountant    Social History Main Topics  . Smoking status: Former Smoker    Packs/day: 0.25    Quit date: 2013  . Smokeless tobacco: Never Used  . Alcohol use     1-2 Glasses of  wine per week       . Drug use: No  . Sexual activity: Yes    Birth control/ protection: Surgical   Current Outpatient Medications on File Prior to Visit  Medication Sig Dispense Refill  . Azelaic Acid (FINACEA EX) Apply topically.    . clonazePAM (KLONOPIN) 1 MG tablet TAKE 1 TABLET BY MOUTH ONCE DAILY AS NEEDED 90 tablet 3  . CONTOUR NEXT TEST test strip USE UP TO 8 TIMES A DAY 300 each 6  . fluconazole (DIFLUCAN) 100 MG tablet TAKE 1 TABLET BY MOUTH ONCE DAILY 3 tablet 3  . glipiZIDE (GLUCOTROL) 5 MG tablet TAKE 1 TABLET BY MOUTH TWICE DAILY BEFORE A MEAL 180 tablet 3  . INVOKANA 100 MG TABS tablet TAKE 1 TABLET BY MOUTH ONCE DAILY BEFORE BREAKFAST 30 tablet 5  . losartan-hydrochlorothiazide (HYZAAR) 100-25 MG tablet TAKE ONE TABLET BY MOUTH once DAILY 90 tablet 4  . metFORMIN (GLUCOPHAGE) 1000 MG tablet TAKE ONE TABLET BY MOUTH TWICE DAILY 180 tablet 3  . metroNIDAZOLE (METROGEL) 1 % gel Apply topically daily. 45 g 10  . montelukast (SINGULAIR) 10 MG tablet Take 1 tablet (10 mg total) by mouth daily. 90 tablet 3  . simvastatin (ZOCOR) 40 MG tablet Take 1 tablet (40 mg total) by mouth at bedtime. 90 tablet 3  . Sulfacetamide Sodium-Sulfur 10-5 % EMUL Apply small amounts once daily 227 g 4  . sulfamethoxazole-trimethoprim (BACTRIM DS,SEPTRA DS) 800-160 MG tablet 1 by mouth twice a day for 7 days then 1 daily at bedtime until bottle empty 28 tablet 1  . terconazole (TERAZOL 3) 0.8 % vaginal cream Place 1 applicator vaginally at bedtime. 20 g 6   No current facility-administered medications on file prior to visit.    Allergies  Allergen Reactions  . Codeine     REACTION: nausea   Family History  Problem Relation Age of Onset  . Hypertension Mother   . Diabetes Mother   . Colon polyps Father   . Hypertension Father   . Heart disease Father   . Heart disease Maternal Grandmother   . Diabetes Sister        gestational  . Hypertension Sister   . Hypertension Brother   . Heart  disease Maternal Grandfather   . Cancer Paternal Grandmother        Kidney cancer  . Kidney cancer Paternal Grandmother   . Breast cancer Neg Hx     PE: BP 138/90   Pulse 84   Ht 5\' 1"  (1.549 m)   Wt 178 lb 6.4 oz (80.9 kg)   SpO2 98%   BMI 33.71 kg/m  Wt Readings from Last 3 Encounters:  11/26/17 178 lb 6.4 oz (80.9 kg)  07/09/17 176 lb (79.8 kg)  05/28/17 176 lb 3.2 oz (79.9 kg)   Constitutional: overweight, in NAD Eyes: PERRLA, EOMI, no exophthalmos ENT: moist mucous membranes, no thyromegaly, no cervical lymphadenopathy Cardiovascular: RRR, No MRG Respiratory: CTA B Gastrointestinal: abdomen soft, NT, ND, BS+ Musculoskeletal: no deformities, strength intact in all 4 Skin: moist, warm, no rashes Neurological: no tremor with outstretched hands, DTR normal in all 4  ASSESSMENT: 1. DM2, non-insulin-dependent, uncontrolled, without long term complications, but with hyperglycemia  2. H/o yeast inf  3. HL  PLAN:  1. Patient with long-standing, previously uncontrolled diabetes, with much improved control after addition of Invokana and after she started to improve her diet and activity.  At last visit, HbA1c was higher, is 6.9%, increased from 6.1%.  This increase was likely due to her stopping walking so her sugar started to increase especially in the morning.  At last visit, we restarted glipizide before dinner.  We also discussed about the importance of starting walking again. - At this visit, she stopped exercising due to starting her new job and also has more anxiety.  She also relaxed her diet. Her sugars are higher in the morning, now up to 180.  We discussed about the importance of restarting exercise especially if she can do it in the second half of the day.  However, she also had some low blood sugars after dinner and during the night so we discussed about not taking more than 5 mg of glipizide before her meals.  Reducing her hypoglycemia may decrease the incidence of her  hyperglycemia - No other changes are needed for now - I suggested to:  Patient Instructions  Please continue: - Metformin 2000 mg with dinner - Invokana 100 mg daily before b'fast.  Please decrease: - Glipizide to 5 mg 2x a day  Please return in 6 months with your sugar log.   - today, HbA1c is 6.3% (improved) - continue checking sugars at different times of the day - check 1x a day, rotating checks - advised for yearly eye exams >> she is not UTD.  Again advised to schedule this - Return to clinic in 6 mo with sugar log    2.  H/o yeast vaginitis - We started terconazole at last visit.  She uses this as needed  3. HL - Reviewed latest lipid panel from 12/2016: LDL improved, now lower than 100, triglycerides still slightly high Lab Results  Component Value Date   CHOL 181 01/15/2017   HDL 47.20 01/15/2017   LDLCALC 98 01/15/2017   LDLDIRECT 120.9 04/15/2012   TRIG 178.0 (H) 01/15/2017   CHOLHDL 4 01/15/2017  - Continues Zocor without side effects.  Philemon Kingdom, MD PhD [facility] Endocrin[facility]

## 2017-11-26 NOTE — Addendum Note (Signed)
Addended by: Drucilla Schmidt on: 11/26/2017 02:17 PM   Modules accepted: Orders

## 2017-12-03 ENCOUNTER — Encounter: Payer: Self-pay | Admitting: Family Medicine

## 2017-12-03 DIAGNOSIS — D485 Neoplasm of uncertain behavior of skin: Secondary | ICD-10-CM | POA: Diagnosis not present

## 2017-12-03 DIAGNOSIS — K130 Diseases of lips: Secondary | ICD-10-CM | POA: Diagnosis not present

## 2017-12-03 DIAGNOSIS — L570 Actinic keratosis: Secondary | ICD-10-CM | POA: Diagnosis not present

## 2017-12-03 DIAGNOSIS — L708 Other acne: Secondary | ICD-10-CM | POA: Diagnosis not present

## 2017-12-03 DIAGNOSIS — L905 Scar conditions and fibrosis of skin: Secondary | ICD-10-CM | POA: Diagnosis not present

## 2017-12-03 DIAGNOSIS — D1801 Hemangioma of skin and subcutaneous tissue: Secondary | ICD-10-CM | POA: Diagnosis not present

## 2017-12-03 DIAGNOSIS — L814 Other melanin hyperpigmentation: Secondary | ICD-10-CM | POA: Diagnosis not present

## 2018-01-21 ENCOUNTER — Encounter: Payer: Self-pay | Admitting: Family Medicine

## 2018-01-21 ENCOUNTER — Encounter: Payer: Self-pay | Admitting: Neurology

## 2018-01-21 ENCOUNTER — Other Ambulatory Visit: Payer: Self-pay | Admitting: Radiology

## 2018-01-21 ENCOUNTER — Ambulatory Visit (INDEPENDENT_AMBULATORY_CARE_PROVIDER_SITE_OTHER): Payer: BLUE CROSS/BLUE SHIELD | Admitting: Family Medicine

## 2018-01-21 VITALS — BP 100/60 | Temp 98.9°F | Ht 61.5 in | Wt 179.0 lb

## 2018-01-21 DIAGNOSIS — E1165 Type 2 diabetes mellitus with hyperglycemia: Secondary | ICD-10-CM | POA: Diagnosis not present

## 2018-01-21 DIAGNOSIS — E282 Polycystic ovarian syndrome: Secondary | ICD-10-CM

## 2018-01-21 DIAGNOSIS — F419 Anxiety disorder, unspecified: Secondary | ICD-10-CM | POA: Diagnosis not present

## 2018-01-21 DIAGNOSIS — E785 Hyperlipidemia, unspecified: Secondary | ICD-10-CM | POA: Diagnosis not present

## 2018-01-21 DIAGNOSIS — I10 Essential (primary) hypertension: Secondary | ICD-10-CM

## 2018-01-21 DIAGNOSIS — N952 Postmenopausal atrophic vaginitis: Secondary | ICD-10-CM

## 2018-01-21 DIAGNOSIS — Z23 Encounter for immunization: Secondary | ICD-10-CM

## 2018-01-21 DIAGNOSIS — E119 Type 2 diabetes mellitus without complications: Secondary | ICD-10-CM

## 2018-01-21 DIAGNOSIS — R413 Other amnesia: Secondary | ICD-10-CM

## 2018-01-21 LAB — HEPATIC FUNCTION PANEL
ALT: 49 U/L — AB (ref 0–35)
AST: 23 U/L (ref 0–37)
Albumin: 4.6 g/dL (ref 3.5–5.2)
Alkaline Phosphatase: 52 U/L (ref 39–117)
BILIRUBIN DIRECT: 0.2 mg/dL (ref 0.0–0.3)
BILIRUBIN TOTAL: 1.3 mg/dL — AB (ref 0.2–1.2)
TOTAL PROTEIN: 6.7 g/dL (ref 6.0–8.3)

## 2018-01-21 LAB — LIPID PANEL
CHOL/HDL RATIO: 3
CHOLESTEROL: 150 mg/dL (ref 0–200)
HDL: 44 mg/dL (ref >39.00)
LDL CALC: 86 mg/dL (ref 0–99)
NonHDL: 106.44
Triglycerides: 102 mg/dL (ref 0.0–149.0)
VLDL: 20.4 mg/dL (ref 0.0–40.0)

## 2018-01-21 LAB — BASIC METABOLIC PANEL
BUN: 20 mg/dL (ref 6–23)
CO2: 29 meq/L (ref 19–32)
Calcium: 9.7 mg/dL (ref 8.4–10.5)
Chloride: 104 mEq/L (ref 96–112)
Creatinine, Ser: 0.83 mg/dL (ref 0.40–1.20)
GFR: 75.9 mL/min (ref >60.00)
GLUCOSE: 153 mg/dL — AB (ref 70–99)
POTASSIUM: 4.1 meq/L (ref 3.5–5.1)
SODIUM: 141 meq/L (ref 135–145)

## 2018-01-21 LAB — MICROALBUMIN / CREATININE URINE RATIO
Creatinine,U: 115.9 mg/dL
MICROALB/CREAT RATIO: 1.8 mg/g (ref 0.0–30.0)
Microalb, Ur: 2.1 mg/dL — ABNORMAL HIGH (ref 0.0–1.9)

## 2018-01-21 LAB — CBC WITH DIFFERENTIAL/PLATELET
BASOS ABS: 0.1 10*3/uL (ref 0.0–0.1)
BASOS PCT: 1.1 % (ref 0.0–3.0)
EOS ABS: 0.2 10*3/uL (ref 0.0–0.7)
Eosinophils Relative: 3.1 % (ref 0.0–5.0)
HEMATOCRIT: 45.6 % (ref 36.0–46.0)
HEMOGLOBIN: 15.6 g/dL — AB (ref 12.0–15.0)
LYMPHS PCT: 37.3 % (ref 12.0–46.0)
Lymphs Abs: 2.1 10*3/uL (ref 0.7–4.0)
MCHC: 34.2 g/dL (ref 30.0–36.0)
MCV: 94.8 fl (ref 78.0–100.0)
MONOS PCT: 7.2 % (ref 3.0–12.0)
Monocytes Absolute: 0.4 10*3/uL (ref 0.1–1.0)
NEUTROS ABS: 2.8 10*3/uL (ref 1.4–7.7)
Neutrophils Relative %: 51.3 % (ref 43.0–77.0)
Platelets: 169 10*3/uL (ref 150.0–400.0)
RBC: 4.81 Mil/uL (ref 3.87–5.11)
RDW: 12.8 % (ref 11.5–15.5)
WBC: 5.5 10*3/uL (ref 4.0–10.5)

## 2018-01-21 LAB — TSH: TSH: 1.68 u[IU]/mL (ref 0.35–4.50)

## 2018-01-21 MED ORDER — LOSARTAN POTASSIUM-HCTZ 100-25 MG PO TABS: ORAL_TABLET | ORAL | 4 refills | Status: DC

## 2018-01-21 MED ORDER — SIMVASTATIN 40 MG PO TABS: 40.0000 mg | ORAL_TABLET | Freq: Every day | ORAL | 3 refills | Status: DC

## 2018-01-21 MED ORDER — MONTELUKAST SODIUM 10 MG PO TABS: 10.0000 mg | ORAL_TABLET | Freq: Every day | ORAL | 3 refills | Status: DC

## 2018-01-21 NOTE — Progress Notes (Signed)
Monica Cunningham is a 57 year old female G3, P3 who comes in today for annual physical examination for the following issues  She has a history of diabetes and is followed in endocrinology by Dr. Lorie Apley.  She is currently on Invokana Glucotrol and metformin.  Last A1c in August 2019 was 6.3%  She has a history of underlying hypertension.  She is on Hyzaar 100-25 daily.  BP today normal  She has a history of hyperlipidemia is on Zocor 40 mg nightly.  Lipids will be checked today  She has a history of anxiety.  She is currently taking Klonopin 1 mg nightly for sleep however she says over the last 3 months her anxiety has seemed to be getting worse.  She seen a counselor in the past.  Advised to go back and see them for cognitive behavioral therapy  She seen her dermatologist because of numerous skin issues.  She was told she had a precancerous issue on her neck she would like frozen  She is having issue with vaginal dryness.  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  Ovaries were left intact.  Now she is having dry skin dry vagina.  We discussed various options.  She is also noticed some mental status changes to concern her.  She is no any mistakes in her work that she does not normally make.  I recommend we get a neurologic consult.  Vaccinations up-to-date seasonal flu shot given today information given on the shingles vaccine  She also takes Singulair 10 mg daily because of a history of allergic rhinitis  Last colonoscopy was 10 years.  Advised to call GI and get set up for colonoscopy.  Last bone density was 12 years ago.  She has not had an eye exam this year.  Recommend annual eye exam.  She does get regular dental care.  Mammography due for December 2019.  14 point review of system review otherwise negative except for above  Social history........Marland Kitchen divorced 2 children works as an Optometrist.  She exercises by walking 4 days a week  .vs BP 100/60 (BP Location: Right  Arm, Patient Position: Sitting, Cuff Size: Large)   Temp 98.9 F (37.2 C) (Oral)   Ht 5' 1.5" (1.562 m)   Wt 179 lb (81.2 kg)   BMI 33.27 kg/m  Well-developed well-nourished female no acute distress vital signs stable she is afebrile examination of the head eyes ears nose and throat were negative except for red lesion on left side of her neck which was frozen.  This was recommended by her dermatologist.  It was biopsied and she was told it was a precancerous lesion and need to be treated with cryotherapy which I did.  Cardiopulmonary exam normal.  Breast exam normal except for scars from previous reduction mammoplasty.  Abdominal exam was negative except for scars from previous abdominoplasty.  Pelvic not indicated extremities normal skin normal peripheral pulses normal  1.  Healthy female #2 diabetes type 2....... continue current meds follow-up by endocrinology  3.  Hyperlipidemia......... continue Zocor and aspirin......... check labs  4.  Anxiety........... continue Klonopin 1 p.o. nightly as needed and start CBT with psychologist  5.  Vaginal dryness............. OTC medication check prolactin level and testosterone level because of new hair growth.  6.  Question about cognitive ability.......... neuro consult

## 2018-01-21 NOTE — Patient Instructions (Signed)
Labs today..........Marland Kitchen we will call the report next week  We will set you up with a bone density  Call GI and get set up for your follow-up colonoscopy  Call and get set up for your annual eye exam  Make an appointment with your counselor to discuss the anxiety issues  For vaginal dryness we recommend Astra glide and/or coconut oil  We will also get you set up a neurologic consult because of your concerned about the issues we discussed.

## 2018-01-22 ENCOUNTER — Other Ambulatory Visit (INDEPENDENT_AMBULATORY_CARE_PROVIDER_SITE_OTHER): Payer: BLUE CROSS/BLUE SHIELD

## 2018-01-22 ENCOUNTER — Other Ambulatory Visit: Payer: Self-pay | Admitting: Family Medicine

## 2018-01-22 DIAGNOSIS — R739 Hyperglycemia, unspecified: Secondary | ICD-10-CM

## 2018-01-22 LAB — PROLACTIN: Prolactin: 3.2 ng/mL

## 2018-01-22 LAB — HEMOGLOBIN A1C: Hgb A1c MFr Bld: 6.9 % — ABNORMAL HIGH (ref 4.6–6.5)

## 2018-01-25 ENCOUNTER — Other Ambulatory Visit: Payer: Self-pay | Admitting: Family Medicine

## 2018-01-25 MED ORDER — SULFAMETHOXAZOLE-TRIMETHOPRIM 800-160 MG PO TABS: 1.0000 | ORAL_TABLET | Freq: Two times a day (BID) | ORAL | 1 refills | Status: DC

## 2018-01-26 LAB — TESTOS,TOTAL,FREE AND SHBG (FEMALE)
Free Testosterone: 3.3 pg/mL (ref 0.1–6.4)
Sex Hormone Binding: 6 nmol/L — ABNORMAL LOW (ref 17–124)
TESTOSTERONE, TOTAL, LC-MS-MS: 14 ng/dL (ref 2–45)

## 2018-01-30 ENCOUNTER — Encounter: Payer: Self-pay | Admitting: Family Medicine

## 2018-02-13 ENCOUNTER — Other Ambulatory Visit: Payer: Self-pay

## 2018-02-13 MED ORDER — LOSARTAN POTASSIUM 100 MG PO TABS: 100.0000 mg | ORAL_TABLET | Freq: Every day | ORAL | 3 refills | Status: DC

## 2018-02-13 MED ORDER — HYDROCHLOROTHIAZIDE 25 MG PO TABS: 25.0000 mg | ORAL_TABLET | Freq: Every day | ORAL | 0 refills | Status: DC

## 2018-02-26 ENCOUNTER — Other Ambulatory Visit: Payer: Self-pay | Admitting: Family Medicine

## 2018-02-26 NOTE — Telephone Encounter (Signed)
Call in #[phone] with no rf 

## 2018-02-26 NOTE — Telephone Encounter (Signed)
Dr. Sarajane Jews please advise if ok to refill until her appt on 03/17/2018?  Thanks

## 2018-02-26 NOTE — Telephone Encounter (Signed)
Message routed to PCP CMA for refills.  Is this ok for refill until she is able to be seen 03/17/2018?  Please advise

## 2018-03-13 ENCOUNTER — Other Ambulatory Visit: Payer: Self-pay | Admitting: Family Medicine

## 2018-03-13 DIAGNOSIS — Z1231 Encounter for screening mammogram for malignant neoplasm of breast: Secondary | ICD-10-CM

## 2018-03-14 ENCOUNTER — Other Ambulatory Visit: Payer: Self-pay | Admitting: Internal Medicine

## 2018-03-17 ENCOUNTER — Ambulatory Visit: Payer: BLUE CROSS/BLUE SHIELD | Admitting: Family Medicine

## 2018-03-17 ENCOUNTER — Encounter: Payer: Self-pay | Admitting: Family Medicine

## 2018-03-17 VITALS — BP 128/82 | HR 81 | Temp 98.3°F | Wt 177.6 lb

## 2018-03-17 DIAGNOSIS — I10 Essential (primary) hypertension: Secondary | ICD-10-CM | POA: Diagnosis not present

## 2018-03-17 DIAGNOSIS — N760 Acute vaginitis: Secondary | ICD-10-CM

## 2018-03-17 DIAGNOSIS — E785 Hyperlipidemia, unspecified: Secondary | ICD-10-CM | POA: Diagnosis not present

## 2018-03-17 DIAGNOSIS — E1165 Type 2 diabetes mellitus with hyperglycemia: Secondary | ICD-10-CM | POA: Diagnosis not present

## 2018-03-17 MED ORDER — TERCONAZOLE 0.8 % VA CREA: 1.0000 | TOPICAL_CREAM | Freq: Every day | VAGINAL | 11 refills | Status: DC

## 2018-03-17 NOTE — Progress Notes (Signed)
   Subjective:    Patient ID: Monica Cunningham, female    DOB: 1960-11-25, 57 y.o.   MRN: [id number]  HPI Here for on intro visit after transferring from Dr. Sherren Mocha, who retired. She feels well. Her BP has been well controlled. She sees Dr. Cruzita Lederer for her diabetes, and this has been fairly well controlled. Her last A1c on 01-22-18 was 6.9. She has been experiencing some mild memory losses, and she will be seeing Dr. Delice Lesch in Neurology on 04-15-18 to evaluate this. She gets regular mammograms.    Review of Systems  Constitutional: Negative.   HENT: Negative.   Eyes: Negative.   Respiratory: Negative.   Cardiovascular: Negative.   Gastrointestinal: Negative.   Genitourinary: Negative for decreased urine volume, difficulty urinating, dyspareunia, dysuria, enuresis, flank pain, frequency, hematuria, pelvic pain and urgency.  Musculoskeletal: Negative.   Skin: Negative.   Neurological: Negative.   Psychiatric/Behavioral: Negative.        Objective:   Physical Exam Constitutional:      Appearance: Normal appearance.  Neck:     Musculoskeletal: Neck supple.  Cardiovascular:     Rate and Rhythm: Normal rate and regular rhythm.     Pulses: Normal pulses.     Heart sounds: Normal heart sounds.  Pulmonary:     Effort: Pulmonary effort is normal.     Breath sounds: Normal breath sounds.  Musculoskeletal:     Right lower leg: No edema.  Lymphadenopathy:     Cervical: No cervical adenopathy.  Neurological:     General: No focal deficit present.     Mental Status: She is alert and oriented to person, place, and time.           Assessment & Plan:  Intro visit. She seems to be doing well in general. She will be due for another colonoscopy next October.  Alysia Penna, MD

## 2018-03-18 ENCOUNTER — Ambulatory Visit
Admission: RE | Admit: 2018-03-18 | Discharge: 2018-03-18 | Disposition: A | Discharge status: Home / Self Care | Payer: BLUE CROSS/BLUE SHIELD | Source: Ambulatory Visit

## 2018-03-18 DIAGNOSIS — Z1231 Encounter for screening mammogram for malignant neoplasm of breast: Secondary | ICD-10-CM

## 2018-04-10 ENCOUNTER — Other Ambulatory Visit: Payer: Self-pay | Admitting: Internal Medicine

## 2018-04-11 ENCOUNTER — Encounter: Payer: Self-pay | Admitting: Family Medicine

## 2018-04-11 DIAGNOSIS — H5203 Hypermetropia, bilateral: Secondary | ICD-10-CM | POA: Diagnosis not present

## 2018-04-11 DIAGNOSIS — E119 Type 2 diabetes mellitus without complications: Secondary | ICD-10-CM | POA: Diagnosis not present

## 2018-04-11 DIAGNOSIS — H04123 Dry eye syndrome of bilateral lacrimal glands: Secondary | ICD-10-CM | POA: Diagnosis not present

## 2018-04-11 LAB — HM DIABETES EYE EXAM

## 2018-04-15 ENCOUNTER — Ambulatory Visit: Payer: BLUE CROSS/BLUE SHIELD | Admitting: Neurology

## 2018-04-15 ENCOUNTER — Encounter: Payer: Self-pay | Admitting: Neurology

## 2018-04-15 ENCOUNTER — Other Ambulatory Visit (INDEPENDENT_AMBULATORY_CARE_PROVIDER_SITE_OTHER): Payer: BLUE CROSS/BLUE SHIELD

## 2018-04-15 ENCOUNTER — Other Ambulatory Visit: Payer: Self-pay

## 2018-04-15 VITALS — BP 128/76 | HR 75 | Ht 61.0 in | Wt 180.0 lb

## 2018-04-15 DIAGNOSIS — R413 Other amnesia: Secondary | ICD-10-CM

## 2018-04-15 DIAGNOSIS — R4189 Other symptoms and signs involving cognitive functions and awareness: Secondary | ICD-10-CM | POA: Diagnosis not present

## 2018-04-15 NOTE — Progress Notes (Signed)
NEUROLOGY CONSULTATION NOTE  Kamil Hanigan MRN: [id number] DOB: 06/17/1960  Referring provider: Dr. Stevie Kern Primary care provider: Dr. Alysia Penna  Reason for consult:  Memory loss  Dear Dr Sherren Mocha:  Thank you for your kind referral of Monica Cunningham for consultation of the above symptoms. Although her history is well known to you, please allow me to reiterate it for the purpose of our medical record. She is alone in the office today. Records and images were personally reviewed where available.  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 She feels her memory is not as good as it used to be. She started noticing a few things slipping 1.5 years ago. Last April she was doing a tax return and made a mistake, she sent the file back and her client noticed the numbers did not reconcile. She looked back and had no idea where the numbers came from, they made no sense. She then started noticing that when she would send emails, she would put the wrong word "it" for "if" or "your" and "you're," or leave out smaller words, she would look back and "it looks like I was uneducated." Her boss would notice this as well and alert her that she might want to read the email again. She states this is not like her to do this. 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 Last week she asked her boss to do something, forgetting that she was out of the office. She denies missing medications but has to check behind her more. She denies missing bill payments or getting lost driving. If she starts a  project in one room, she gets distracted easily now and forgets that she was working on something. She is alone at home 2-3 days a week, her older children come home several times a week and her fiance from [place] is in and out. She reports her mood is good for the most part, she is a little down sometimes. She is currently dealing with issues with her son. She has prn clonazepam for anxiety which she takes 4-5 times at night, less than she was taking it before.   She denies any headaches, dizziness, vision changes, focal numbness/tingling/weakness, bowel/bladder dysfunction, neck pain, anosmia, or tremors. She has back pain. She has been having hot flashes and vaginal dryness, she had a hysterectomy in 2004/2005. Her maternal aunt had Alzheimer's disease. No history of significant head injuries. She drinks alcohol usually on the weekend.   Laboratory Data: Lab Results  Component Value Date   TSH 1.68 01/21/2018     PAST MEDICAL HISTORY: Past Medical History:  Diagnosis Date  . ALLERGIC RHINITIS 09/15/2007  . DM (diabetes mellitus) ([place])    sees Dr. Cruzita Lederer   . Endometriosis   . Essential hypertension, benign 01/26/2007  . Fibroid   . HYPERLIPIDEMIA 09/15/2007  . Insomnia   . Obesity   . PCOS (polycystic ovarian syndrome)   . POLYCYSTIC OVARIAN DISEASE 09/15/2007  . Rosacea     PAST SURGICAL HISTORY: Past Surgical History:  Procedure Laterality Date  . ABDOMINAL  HYSTERECTOMY    . CESAREAN SECTION     x2  . CHOLECYSTECTOMY    . COLONOSCOPY  01/21/2009   per Dr. Carlean Purl, clear, repeat in 10 yrs   . PELVIC LAPAROSCOPY    . REDUCTION MAMMAPLASTY Bilateral   . REFRACTIVE SURGERY    . TUBAL LIGATION    . tummy tuck      MEDICATIONS: Current Outpatient Medications on File Prior to Visit  Medication Sig Dispense Refill  . aspirin EC 81 MG tablet Take 81 mg by mouth daily.    . Azelaic Acid (FINACEA EX) Apply topically.    . clonazePAM (KLONOPIN) 1 MG tablet TAKE 1 TABLET BY  MOUTH ONCE DAILY AS NEEDED 90 tablet 0  . CONTOUR NEXT TEST test strip USE UP TO 8 TIMES A DAY 300 each 6  . fluconazole (DIFLUCAN) 100 MG tablet TAKE 1 TABLET BY MOUTH ONCE DAILY 3 tablet 3  . glipiZIDE (GLUCOTROL) 5 MG tablet TAKE 1 TABLET BY MOUTH TWICE DAILY BEFORE A MEAL 180 tablet 0  . hydrochlorothiazide (HYDRODIURIL) 25 MG tablet Take 1 tablet (25 mg total) by mouth daily. (Patient not taking: Reported on 03/17/2018) 90 tablet 0  . INVOKANA 100 MG TABS tablet TAKE 1 TABLET BY MOUTH ONCE DAILY BEFORE BREAKFAST 30 tablet 0  . losartan (COZAAR) 100 MG tablet Take 1 tablet (100 mg total) by mouth daily. (Patient not taking: Reported on 03/17/2018) 90 tablet 3  . losartan-hydrochlorothiazide (HYZAAR) 100-25 MG tablet TAKE ONE TABLET BY MOUTH once DAILY 90 tablet 4  . metFORMIN (GLUCOPHAGE) 1000 MG tablet Take 1 tablet (1,000 mg total) by mouth 2 (two) times daily. 180 tablet 3  . metroNIDAZOLE (METROGEL) 1 % gel Apply topically daily. 45 g 10  . montelukast (SINGULAIR) 10 MG tablet Take 1 tablet (10 mg total) by mouth daily. 90 tablet 3  . simvastatin (ZOCOR) 40 MG tablet Take 1 tablet (40 mg total) by mouth at bedtime. 90 tablet 3  . Sulfacetamide Sodium-Sulfur 10-5 % EMUL Apply small amounts once daily 227 g 4  . terconazole (TERAZOL 3) 0.8 % vaginal cream Place 1 applicator vaginally at bedtime. 20 g 11   No current facility-administered medications on file prior to visit.     ALLERGIES: Allergies  Allergen Reactions  . Codeine     REACTION: nausea    FAMILY HISTORY: Family History  Problem Relation Age of Onset  . Hypertension Mother   . Diabetes Mother   . Colon polyps Father   . Hypertension Father   . Heart disease Father   . Heart disease Maternal Grandmother   . Diabetes Sister        gestational  . Hypertension Sister   . Hypertension Brother   . Heart disease Maternal Grandfather   . Cancer Paternal Grandmother        Kidney cancer  . Kidney cancer Paternal  Grandmother   . Breast cancer Neg Hx     SOCIAL HISTORY: Social History   Socioeconomic History  . Marital status: Divorced    Spouse name: Not on file  . Number of children: 3  . Years of education: Not on file  . Highest education level: Not on file  Occupational History    Employer: [employer]  Social Needs  . Financial resource strain: Not on file  . Food insecurity:    Worry: Not on file    Inability: Not on file  . Transportation needs:    Medical: Not on  file    Non-medical: Not on file  Tobacco Use  . Smoking status: Former Smoker    Packs/day: 0.25    Last attempt to quit: 09/02/2010    Years since quitting: 7.6  . Smokeless tobacco: Never Used  Substance and Sexual Activity  . Alcohol use: Yes    Alcohol/week: 8.0 standard drinks    Types: 8 Glasses of wine per week    Comment: ocassionally  . Drug use: No  . Sexual activity: Yes    Birth control/protection: Surgical  Lifestyle  . Physical activity:    Days per week: Not on file    Minutes per session: Not on file  . Stress: Not on file  Relationships  . Social connections:    Talks on phone: Not on file    Gets together: Not on file    Attends religious service: Not on file    Active member of club or organization: Not on file    Attends meetings of clubs or organizations: Not on file    Relationship status: Not on file  . Intimate partner violence:    Fear of current or ex partner: Not on file    Emotionally abused: Not on file    Physically abused: Not on file    Forced sexual activity: Not on file  Other Topics Concern  . Not on file  Social History Narrative  . Not on file    REVIEW OF SYSTEMS: Constitutional: No fevers, chills, or sweats, no generalized fatigue, change in appetite Eyes: No visual changes, double vision, eye pain Ear, nose and throat: No hearing loss, ear pain, nasal congestion, sore throat Cardiovascular: No chest pain, palpitations Respiratory:  No shortness of breath  at rest or with exertion, wheezes GastrointestinaI: No nausea, vomiting, diarrhea, abdominal pain, fecal incontinence Genitourinary:  No dysuria, urinary retention or frequency Musculoskeletal:  No neck pain, +back pain Integumentary: No rash, pruritus, skin lesions Neurological: as above Psychiatric: No depression, insomnia, anxiety Endocrine: No palpitations, fatigue, diaphoresis, mood swings, change in appetite, change in weight, increased thirst Hematologic/Lymphatic:  No anemia, purpura, petechiae. Allergic/Immunologic: no itchy/runny eyes, nasal congestion, recent allergic reactions, rashes  PHYSICAL EXAM: Vitals:   04/15/18 0911  BP: 128/76  Pulse: 75  SpO2: 96%   General: No acute distress Head:  Normocephalic/atraumatic Eyes: Fundoscopic exam shows bilateral sharp discs, no vessel changes, exudates, or hemorrhages Neck: supple, no paraspinal tenderness, full range of motion Back: No paraspinal tenderness Heart: regular rate and rhythm Lungs: Clear to auscultation bilaterally. Vascular: No carotid bruits. Skin/Extremities: No rash, no edema Neurological Exam: Mental status: alert and oriented to person, place, and time, no dysarthria or aphasia, Fund of knowledge is appropriate.  Recent and remote memory are intact.  Attention and concentration are normal. Decreased fluency. Notable to have difficulty with serial 7s.  Able to name objects and repeat phrases.  Montreal Cognitive Assessment  04/15/2018  Visuospatial/ Executive (0/5) 4  Naming (0/3) 3  Attention: Read list of digits (0/2) 1  Attention: Read list of letters (0/1) 1  Attention: Serial 7 subtraction starting at 100 (0/3) 2  Language: Repeat phrase (0/2) 2  Language : Fluency (0/1) 1  Abstraction (0/2) 2  Delayed Recall (0/5) 3  Orientation (0/6) 6  Total 25   Cranial nerves: CN I: not tested CN II: pupils equal, round and reactive to light, visual fields intact, fundi unremarkable. CN III, IV, VI:  full  range of motion, no nystagmus, no ptosis CN V:  facial sensation intact CN VII: upper and lower face symmetric CN VIII: hearing intact to finger rub CN IX, X: gag intact, uvula midline CN XI: sternocleidomastoid and trapezius muscles intact CN XII: tongue midline Bulk & Tone: normal, no fasciculations. Motor: 5/5 throughout with no pronator drift. Sensation: intact to light touch, cold, pin, vibration and joint position sense.  No extinction to double simultaneous stimulation.  Romberg test negative Deep Tendon Reflexes: +2 throughout, no ankle clonus Plantar responses: downgoing bilaterally Cerebellar: no incoordination on finger to nose, heel to shin. No dysdiadochokinesia Gait: narrow-based and steady, able to tandem walk adequately. 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 Her neurological exam is normal, MOCA score 25/30 indicating mild cognitive impairment, it is notable she had difficulty with serial 7s as she works with numbers. We discussed different causes of memory loss. Check B12 level. MRI brain without contrast will be ordered to assess for underlying structural abnormality and assess vascular load. We discussed how mood and stress can also affect memory. She will be scheduled for Neurocognitive testing to further evaluate cognitive complaints, she would like to do this after tax season. No clear indication to start cholinesterase inhibitors at this time. We discussed the importance of control of vascular risk factors, physical exercise, and brain stimulation exercises for brain health. Follow-up in 6 months, she knows to call for any changes.   Thank you for allowing me to participate in the care of this patient. Please do not hesitate to call for any questions or concerns.   Ellouise Newer,  M.D.  CC: Dr. Sherren Mocha, Dr. Sarajane Jews

## 2018-04-15 NOTE — Patient Instructions (Addendum)
1. Schedule MRI brain without contrast  We have sent a referral to [facility] for your MRI and they will call you directly to schedule your appt. They are located at [place]. If you need to contact them directly please call [phone].  2. Bloodwork for B12  Your provider requests that you have LABS drawn today.  We share a lab with [facility] - they are located in [address] (second floor) of this building.  Once you get there, please have a seat and the phlebotomist will call your name.  If you have waited more than 15 minutes, please advise the front desk  3. Recommend Neurocognitive testing, please contact our office around 2 months before the time you would like testing done so we can send referral for scheduling 4. Follow-up in 6 months, call for any changes   RECOMMENDATIONS FOR ALL PATIENTS WITH MEMORY PROBLEMS: 1. Continue to exercise (Recommend 30 minutes of walking everyday, or 3 hours every week) 2. Increase social interactions - continue going to [place] and enjoy social gatherings with friends and family 3. Eat healthy, avoid fried foods and eat more fruits and vegetables 4. Maintain adequate blood pressure, blood sugar, and blood cholesterol level. Reducing the risk of stroke and cardiovascular disease also helps promoting better memory. 5. Avoid stressful situations. Live a simple life and avoid aggravations. Organize your time and prepare for the next day in anticipation. 6. Sleep well, avoid any interruptions of sleep and avoid any distractions in the bedroom that may interfere with adequate sleep quality 7. Avoid sugar, avoid sweets as there is a strong link between excessive sugar intake, diabetes, and cognitive impairment We discussed the Mediterranean diet, which has been shown to help patients reduce the risk of progressive memory disorders and reduces cardiovascular risk. This includes eating fish, eat fruits and green leafy vegetables,  nuts like almonds and hazelnuts, walnuts, and also use olive oil. Avoid fast foods and fried foods as much as possible. Avoid sweets and sugar as sugar use has been linked to worsening of memory function.

## 2018-04-16 LAB — VITAMIN B12: VITAMIN B 12: 412 pg/mL (ref 200–1100)

## 2018-04-29 ENCOUNTER — Other Ambulatory Visit: Payer: BLUE CROSS/BLUE SHIELD

## 2018-05-13 ENCOUNTER — Other Ambulatory Visit: Payer: Self-pay | Admitting: Internal Medicine

## 2018-06-03 ENCOUNTER — Encounter: Payer: Self-pay | Admitting: Internal Medicine

## 2018-06-03 ENCOUNTER — Ambulatory Visit: Payer: BLUE CROSS/BLUE SHIELD | Admitting: Internal Medicine

## 2018-06-03 ENCOUNTER — Other Ambulatory Visit: Payer: Self-pay

## 2018-06-03 VITALS — BP 140/80 | HR 71 | Ht 61.0 in | Wt 178.0 lb

## 2018-06-03 DIAGNOSIS — E785 Hyperlipidemia, unspecified: Secondary | ICD-10-CM

## 2018-06-03 DIAGNOSIS — E1165 Type 2 diabetes mellitus with hyperglycemia: Secondary | ICD-10-CM | POA: Diagnosis not present

## 2018-06-03 DIAGNOSIS — E669 Obesity, unspecified: Secondary | ICD-10-CM

## 2018-06-03 DIAGNOSIS — Z6832 Body mass index (BMI) 32.0-32.9, adult: Secondary | ICD-10-CM | POA: Diagnosis not present

## 2018-06-03 LAB — POCT GLYCOSYLATED HEMOGLOBIN (HGB A1C): Hemoglobin A1C: 6.7 % — AB (ref 4.0–5.6)

## 2018-06-03 MED ORDER — CANAGLIFLOZIN 100 MG PO TABS: ORAL_TABLET | ORAL | 11 refills | Status: DC

## 2018-06-03 MED ORDER — GLIPIZIDE 5 MG PO TABS: ORAL_TABLET | ORAL | 3 refills | Status: DC

## 2018-06-03 MED ORDER — GLUCOSE BLOOD VI STRP: ORAL_STRIP | 11 refills | Status: AC

## 2018-06-03 MED ORDER — METFORMIN HCL 1000 MG PO TABS: 2000.0000 mg | ORAL_TABLET | Freq: Every day | ORAL | 3 refills | Status: DC

## 2018-06-03 NOTE — Patient Instructions (Addendum)
Please change: - Metformin 2000 mg with dinner  Continue: - Invokana 100 mg daily before b'fast - Glipizide 5-10 mg 2x a day before meals  Please return in 4 months with your sugar log.

## 2018-06-03 NOTE — Addendum Note (Signed)
Addended by: Cardell Peach I on: 06/03/2018 01:19 PM   Modules accepted: Orders

## 2018-06-03 NOTE — Progress Notes (Signed)
Patient ID: Monica Cunningham, female   DOB: Sep 21, 1960, 58 y.o.   MRN: [id number]   HPI: Monica Cunningham is a 58 y.o.-year-old female, returning for f/u for DM2, dx as GDM in 2003, then DM2 in 2005, non-insulin-dependent, uncontrolled, without long term complications. Last visit 6 months ago.  She had a URI >> was on ABx and steroids in 04/2018.  Last hemoglobin A1c was: Lab Results  Component Value Date   HGBA1C 6.9 (H) 01/22/2018   HGBA1C 6.3 (A) 11/26/2017   HGBA1C 6.9 05/28/2017   Pt is on a regimen of: - Metformin 1000 mg 2x a day with meals - Invokana 100 mg daily before b'fast - Glipizide 5 >> 10 mg 2x a day before meals  Pt checks her sugars twice a day: - am: 127-156 >> 123-182 >> 122-196, 219 - 2h after b'fast: 130-140s, 150s >> n/c  - before lunch: 120-130 >> 93-142, 165 >> 126-170 - 2h after lunch: 200s >> 187, 272 >> 101-182 - before dinner: 86-144 >>  95-127, 167 >> 90-130 - 2h after dinner: 110-130s>> 60-150 >> 68-206 - bedtime: 74-223 >> see above >> 42 >> n/c - nighttime: n/c >> 51 x1, 60, 104-179 >> 91-203 Lowest sugar was 42 at bedtime >> 68; she has hypoglycemia awareness in the 70s. Highest sugar was 272 >> 206.  Glucometer: [employer]  Pt's meals are: - Breakfast: chicken tenders and PB; 1 egg; prev. muffins - Lunch: meat + veggies or salad - Dinner: protein, veggies, pizza; hamburger - Snacks: 3-4, diet soda, unsweet tea  She used to walk in the evening with a friend, but not lately.  She plans to restart.  -No CKD, last BUN/creatinine:  Lab Results  Component Value Date   BUN 20 01/21/2018   BUN 20 01/15/2017   CREATININE 0.83 01/21/2018   CREATININE 0.88 01/15/2017  On losartan. -+ HL. Last set of lipids: Lab Results  Component Value Date   CHOL 150 01/21/2018   HDL 44.00 01/21/2018   LDLCALC 86 01/21/2018   LDLDIRECT 120.9 04/15/2012   TRIG 102.0 01/21/2018   CHOLHDL 3 01/21/2018  On Zocor. - last eye exam was in  04/2018: No DR.   -Denies numbness and tingling in her feet.  She also has a h/o HTN, PCOS, headaches- on nadolol.  She has a history of TAH.  Her dog has [place]'s ds.  ROS: Constitutional: no weight gain/no weight loss, no fatigue, + subjective hyperthermia, no subjective hypothermia Eyes: no blurry vision, no xerophthalmia ENT: no sore throat, no nodules palpated in neck, no dysphagia, no odynophagia, no hoarseness Cardiovascular: no CP/no SOB/no palpitations/no leg swelling Respiratory: no cough/no SOB/no wheezing Gastrointestinal: no N/no V/no D/no C/no acid reflux Musculoskeletal: no muscle aches/no joint aches Skin: no rashes, no hair loss Neurological: no tremors/no numbness/no tingling/no dizziness  I reviewed pt's medications, allergies, PMH, social hx, family hx, and changes were documented in the history of present illness. Otherwise, unchanged from my initial visit note.   Past Medical History:  Diagnosis Date  . ALLERGIC RHINITIS 09/15/2007  . DM (diabetes mellitus) ([place])    sees Dr. Cruzita Lederer   . Endometriosis   . Essential hypertension, benign 01/26/2007  . Fibroid   . HYPERLIPIDEMIA 09/15/2007  . Insomnia   . Obesity   . PCOS (polycystic ovarian syndrome)   . POLYCYSTIC OVARIAN DISEASE 09/15/2007  . Rosacea    Past Surgical History:  Procedure Laterality Date  . ABDOMINAL HYSTERECTOMY    .  CESAREAN SECTION     x2  . CHOLECYSTECTOMY    . COLONOSCOPY  01/21/2009   per Dr. Carlean Purl, clear, repeat in 10 yrs   . PELVIC LAPAROSCOPY    . REDUCTION MAMMAPLASTY Bilateral   . REFRACTIVE SURGERY    . TUBAL LIGATION    . tummy tuck     Social History   Social History  . Marital status: Divorced    Spouse name: N/A  . Number of children: 3   Occupational History  . Accountant    Social History Main Topics  . Smoking status: Former Smoker    Packs/day: 0.25    Quit date: 2013  . Smokeless tobacco: Never Used  . Alcohol use     1-2 Glasses of wine per  week       . Drug use: No  . Sexual activity: Yes    Birth control/ protection: Surgical   Current Outpatient Medications on File Prior to Visit  Medication Sig Dispense Refill  . aspirin EC 81 MG tablet Take 81 mg by mouth daily.    . Azelaic Acid (FINACEA EX) Apply topically.    . clonazePAM (KLONOPIN) 1 MG tablet TAKE 1 TABLET BY MOUTH ONCE DAILY AS NEEDED 90 tablet 0  . CONTOUR NEXT TEST test strip USE UP TO 8 TIMES A DAY 300 each 6  . fluconazole (DIFLUCAN) 100 MG tablet TAKE 1 TABLET BY MOUTH ONCE DAILY 3 tablet 3  . glipiZIDE (GLUCOTROL) 5 MG tablet TAKE 1 TABLET BY MOUTH TWICE DAILY BEFORE A MEAL 180 tablet 0  . INVOKANA 100 MG TABS tablet TAKE 1 TABLET BY MOUTH ONCE DAILY BEFORE BREAKFAST 30 tablet 0  . losartan-hydrochlorothiazide (HYZAAR) 100-25 MG tablet TAKE ONE TABLET BY MOUTH once DAILY 90 tablet 4  . metFORMIN (GLUCOPHAGE) 1000 MG tablet Take 1 tablet (1,000 mg total) by mouth 2 (two) times daily. 180 tablet 3  . metroNIDAZOLE (METROGEL) 1 % gel Apply topically daily. 45 g 10  . montelukast (SINGULAIR) 10 MG tablet Take 1 tablet (10 mg total) by mouth daily. 90 tablet 3  . simvastatin (ZOCOR) 40 MG tablet Take 1 tablet (40 mg total) by mouth at bedtime. 90 tablet 3  . Sulfacetamide Sodium-Sulfur 10-5 % EMUL Apply small amounts once daily 227 g 4  . terconazole (TERAZOL 3) 0.8 % vaginal cream Place 1 applicator vaginally at bedtime. 20 g 11   No current facility-administered medications on file prior to visit.    Allergies  Allergen Reactions  . Codeine     REACTION: nausea   Family History  Problem Relation Age of Onset  . Hypertension Mother   . Diabetes Mother   . Colon polyps Father   . Hypertension Father   . Heart disease Father   . Heart disease Maternal Grandmother   . Diabetes Sister        gestational  . Hypertension Sister   . Hypertension Brother   . Heart disease Maternal Grandfather   . Cancer Paternal Grandmother        Kidney cancer  .  Kidney cancer Paternal Grandmother   . Breast cancer Neg Hx     PE: BP 140/80   Pulse 71   Ht 5\' 1"  (1.549 m)   Wt 178 lb (80.7 kg)   SpO2 95%   BMI 33.63 kg/m  Wt Readings from Last 3 Encounters:  06/03/18 178 lb (80.7 kg)  04/15/18 180 lb (81.6 kg)  03/17/18 177 lb 9 oz (  80.5 kg)   Constitutional: overweight, in NAD Eyes: PERRLA, EOMI, no exophthalmos ENT: moist mucous membranes, no thyromegaly, no cervical lymphadenopathy Cardiovascular: RRR, No MRG Respiratory: CTA B Gastrointestinal: abdomen soft, NT, ND, BS+ Musculoskeletal: no deformities, strength intact in all 4 Skin: moist, warm, no rashes Neurological: no tremor with outstretched hands, DTR normal in all 4  ASSESSMENT: 1. DM2, non-insulin-dependent, uncontrolled, without long term complications, but with hyperglycemia  2. H/o yeast inf  3. HL  4.  Obesity  PLAN:  1. Patient with longstanding, previously uncontrolled, type 2 diabetes, with much improved control after addition of Invokana and after she started to improve her diet and activity.  At last visit, HbA1c improved from 6.9% to 6.3%.  We were able to decrease glipizide as she was having some lows before dinner.  At that time, she stopped exercising due to a new job and also having more anxiety.  She also relaxed her diet and sugars were higher in the morning.  I advised her to restart exercising. -At this visit, sugars appear worse in the morning, and they improve throughout the rest of the day.  Upon questioning, she is not taking the entire metformin dose at night and I strongly advised her to start doing so.  Otherwise, we will continue Invokana and glipizide.  She increase the glipizide since last visit to 5 to 10 mg before each meal.  She had mild lows after dinner so we discussed about only using the 10 mg with a larger meal. - I suggested to:  Patient Instructions  Please change: - Metformin 2000 mg with dinner  Continue: - Invokana 100 mg daily  before b'fast - Glipizide 5-10 mg 2x a day before meals  Please return in 4 months with your sugar log.   - today, HbA1c is 6.7% (better) - continue checking sugars at different times of the day - check 1x a day, rotating checks - advised for yearly eye exams >> she is UTD - I refilled all her diabetic prescriptions - Return to clinic in 4 mo with sugar log     2.  H/o yeast vaginitis -We had to start terconazole for her and she uses this as needed. -No exacerbations  3. HL - Reviewed latest lipid panel from 12/2017: LDL still lower than 100, the rest of the fractions at goal Lab Results  Component Value Date   CHOL 150 01/21/2018   HDL 44.00 01/21/2018   LDLCALC 86 01/21/2018   LDLDIRECT 120.9 04/15/2012   TRIG 102.0 01/21/2018   CHOLHDL 3 01/21/2018  - Continues Zocor without side effects.  4.  Obesity -Continue Invokana which should also help with weight loss -No significant weight gain since last visit  Philemon Kingdom, MD PhD [facility]

## 2018-06-06 ENCOUNTER — Other Ambulatory Visit: Payer: Self-pay | Admitting: Family Medicine

## 2018-06-06 NOTE — Telephone Encounter (Signed)
Call in #[phone] with 2 rf 

## 2018-06-06 NOTE — Telephone Encounter (Signed)
Dr. Fry please advise. Thanks  

## 2018-06-06 NOTE — Telephone Encounter (Signed)
rx has been called to the pharmacy and left on the VM for refills.

## 2018-09-24 ENCOUNTER — Telehealth: Payer: Self-pay | Admitting: Neurology

## 2018-09-24 NOTE — Telephone Encounter (Signed)
FYI

## 2018-09-24 NOTE — Telephone Encounter (Signed)
Pt canceled appt with Delice Lesch 11/04/2018 stating she has been awaiting MRI. Unable to do that since covid per pt and cost oop 800. Pt states she did not secure appt in [place] that [facility] recommended. Pt states there is no need for f/u appt until this is done.

## 2018-10-07 ENCOUNTER — Other Ambulatory Visit: Payer: Self-pay

## 2018-10-07 ENCOUNTER — Encounter: Payer: Self-pay | Admitting: Internal Medicine

## 2018-10-07 ENCOUNTER — Ambulatory Visit: Payer: BLUE CROSS/BLUE SHIELD | Admitting: Internal Medicine

## 2018-10-07 ENCOUNTER — Ambulatory Visit (INDEPENDENT_AMBULATORY_CARE_PROVIDER_SITE_OTHER): Payer: BC Managed Care – PPO | Admitting: Internal Medicine

## 2018-10-07 DIAGNOSIS — E1165 Type 2 diabetes mellitus with hyperglycemia: Secondary | ICD-10-CM | POA: Diagnosis not present

## 2018-10-07 DIAGNOSIS — E785 Hyperlipidemia, unspecified: Secondary | ICD-10-CM

## 2018-10-07 NOTE — Progress Notes (Signed)
Patient ID: Monica Cunningham, female   DOB: Aug 07, 1960, 58 y.o.   MRN: [id number]   Patient location: Home My location: Office  Referring Provider: Laurey Morale, MD  I connected with the patient on 10/07/18 at 8:04 AM EDT by a video enabled telemedicine application and verified that I am speaking with the correct person.   I discussed the limitations of evaluation and management by telemedicine and the availability of in person appointments. The patient expressed understanding and agreed to proceed.   Details of the encounter are shown below.  HPI: Monica Cunningham is a 58 y.o.-year-old female, presenting for f/u for DM2, dx as GDM in 2003, then DM2 in 2005, non-insulin-dependent, uncontrolled, without long term complications. Last visit 4 months ago.  Last hemoglobin A1c was: Lab Results  Component Value Date   HGBA1C 6.7 (A) 06/03/2018   HGBA1C 6.9 (H) 01/22/2018   HGBA1C 6.3 (A) 11/26/2017   Pt is on a regimen of: - Metformin 2000 mg with dinner >> cramping, diarrhea >> 1000 mg 2x a day - Invokana 100 mg daily before b'fast - Glipizide 5-10 mg 2x a day before meals  Pt checks her sugars twice a day: - am:  123-182 >> 122-196, 219 >> 96, 140-160, 164 (late dinner) - 2h after b'fast: 130-140s, 150s >> n/c  - before lunch:  93-142, 165 >> 126-170 >> 150s - 2h after lunch: 187, 272 >> 101-182 >> 90s-120 - before dinner:  95-127, 167 >> 90-130 >> 90-140 (snack) - 2h after dinner: 68-206 >> 90-110 or up to 170s, 200-pizza - bedtime:  see above >> 42 >> n/c - nighttime:51 x1, 60, 104-179 >> 91-203 >> same as above Lowest sugar was 42 at bedtime >> 68 >> 60s - 3x since last OV; she has hypoglycemia awareness in the 70s. Highest sugar was 272 >> 206 >> 200.  Glucometer: [employer] are: - Breakfast: chicken tenders and PB; 1 egg; prev. muffins - Lunch: meat + veggies or salad - Dinner: protein, veggies, pizza; hamburger - Snacks: 3-4, diet soda,  unsweet tea  She restarted to walk in the evening.  -No CKD, last BUN/creatinine:  Lab Results  Component Value Date   BUN 20 01/21/2018   BUN 20 01/15/2017   CREATININE 0.83 01/21/2018   CREATININE 0.88 01/15/2017  On losartan. -+ HL. Last set of lipids: Lab Results  Component Value Date   CHOL 150 01/21/2018   HDL 44.00 01/21/2018   LDLCALC 86 01/21/2018   LDLDIRECT 120.9 04/15/2012   TRIG 102.0 01/21/2018   CHOLHDL 3 01/21/2018  On Zocor. - last eye exam was in 04/2018: No DR - no numbness and tingling in her feet.  She also has a h/o HTN, PCOS, headaches- on nadolol.  She has a history of TAH.  Her dog has Cushing's ds.  ROS: Constitutional: no weight gain/+ weight loss, no fatigue, no subjective hyperthermia, no subjective hypothermia Eyes: no blurry vision, no xerophthalmia ENT: no sore throat, no nodules palpated in neck, no dysphagia, no odynophagia, no hoarseness Cardiovascular: no CP/no SOB/no palpitations/no leg swelling Respiratory: no cough/no SOB/no wheezing Gastrointestinal: no N/no V/no D/no C/no acid reflux Musculoskeletal: no muscle aches/no joint aches Skin: no rashes, no hair loss Neurological: no tremors/no numbness/no tingling/no dizziness  I reviewed pt's medications, allergies, PMH, social hx, family hx, and changes were documented in the history of present illness. Otherwise, unchanged from my initial visit note.   Past Medical History:  Diagnosis Date  .  ALLERGIC RHINITIS 09/15/2007  . DM (diabetes mellitus) ([place])    sees Dr. Cruzita Lederer   . Endometriosis   . Essential hypertension, benign 01/26/2007  . Fibroid   . HYPERLIPIDEMIA 09/15/2007  . Insomnia   . Obesity   . PCOS (polycystic ovarian syndrome)   . POLYCYSTIC OVARIAN DISEASE 09/15/2007  . Rosacea    Past Surgical History:  Procedure Laterality Date  . ABDOMINAL HYSTERECTOMY    . CESAREAN SECTION     x2  . CHOLECYSTECTOMY    . COLONOSCOPY  01/21/2009   per Dr. Carlean Purl, clear,  repeat in 10 yrs   . PELVIC LAPAROSCOPY    . REDUCTION MAMMAPLASTY Bilateral   . REFRACTIVE SURGERY    . TUBAL LIGATION    . tummy tuck     Social History   Social History  . Marital status: Divorced    Spouse name: N/A  . Number of children: 3   Occupational History  . Accountant    Social History Main Topics  . Smoking status: Former Smoker    Packs/day: 0.25    Quit date: 2013  . Smokeless tobacco: Never Used  . Alcohol use     1-2 Glasses of wine per week       . Drug use: No  . Sexual activity: Yes    Birth control/ protection: Surgical   Current Outpatient Medications on File Prior to Visit  Medication Sig Dispense Refill  . aspirin EC 81 MG tablet Take 81 mg by mouth daily.    . Azelaic Acid (FINACEA EX) Apply topically.    . canagliflozin (INVOKANA) 100 MG TABS tablet TAKE 1 TABLET BY MOUTH ONCE DAILY BEFORE BREAKFAST 30 tablet 11  . clonazePAM (KLONOPIN) 1 MG tablet TAKE 1 TABLET BY MOUTH ONCE DAILY AS NEEDED 90 tablet 2  . fluconazole (DIFLUCAN) 100 MG tablet TAKE 1 TABLET BY MOUTH ONCE DAILY 3 tablet 3  . glipiZIDE (GLUCOTROL) 5 MG tablet TAKE 1-2 TABLETs BY MOUTH TWICE DAILY BEFORE A MEAL 360 tablet 3  . glucose blood (CONTOUR NEXT TEST) test strip USE UP TO 8 TIMES A DAY 300 each 11  . losartan-hydrochlorothiazide (HYZAAR) 100-25 MG tablet TAKE ONE TABLET BY MOUTH once DAILY 90 tablet 4  . metFORMIN (GLUCOPHAGE) 1000 MG tablet Take 2 tablets (2,000 mg total) by mouth daily with supper. 180 tablet 3  . metroNIDAZOLE (METROGEL) 1 % gel Apply topically daily. 45 g 10  . montelukast (SINGULAIR) 10 MG tablet Take 1 tablet (10 mg total) by mouth daily. 90 tablet 3  . simvastatin (ZOCOR) 40 MG tablet Take 1 tablet (40 mg total) by mouth at bedtime. 90 tablet 3  . Sulfacetamide Sodium-Sulfur 10-5 % EMUL Apply small amounts once daily 227 g 4  . terconazole (TERAZOL 3) 0.8 % vaginal cream Place 1 applicator vaginally at bedtime. 20 g 11   No current  facility-administered medications on file prior to visit.    Allergies  Allergen Reactions  . Codeine     REACTION: nausea   Family History  Problem Relation Age of Onset  . Hypertension Mother   . Diabetes Mother   . Colon polyps Father   . Hypertension Father   . Heart disease Father   . Heart disease Maternal Grandmother   . Diabetes Sister        gestational  . Hypertension Sister   . Hypertension Brother   . Heart disease Maternal Grandfather   . Cancer Paternal Grandmother  Kidney cancer  . Kidney cancer Paternal Grandmother   . Breast cancer Neg Hx     PE: There were no vitals taken for this visit. Wt Readings from Last 3 Encounters:  06/03/18 178 lb (80.7 kg)  04/15/18 180 lb (81.6 kg)  03/17/18 177 lb 9 oz (80.5 kg)   Constitutional:  in NAD  The physical exam was not performed (virtual visit).  ASSESSMENT: 1. DM2, non-insulin-dependent, uncontrolled, without long term complications, but with hyperglycemia  2. H/o yeast inf  3. HL  4.  Obesity  PLAN:  1. Patient with longstanding, previously uncontrolled, type 2 diabetes, with much better control after adding Invokana and after she started to improve her diet and activity.  At last visit, HbA1c was 6.7% but sugars were worse in the and they were improving throughout the rest of the day.  At that time she was not taking the entire metformin dose at night and I advised her to start doing so.  She is using glipizide 5 or 10 units depending on the size of the meals.  She tolerates Invokana well. -At this visit, sugars have improved, but she still has hyperglycemic bikes after dietary indiscretions.  She is trying to reduce these.  She could not tolerate taking the entire metformin dose with dinner so she is now splitting it into 2 doses a day.  We will continue this.  We discussed about maybe adding insulin before dinner, but for now, she would like to continue to work on her diet and recheck in another 3 to  4 months before we decide about intensifying her treatment.  As she is occasionally having low blood sugars after dinner, we discussed about skipping the dose of glipizide if she has a lighter meal or if she is particularly active in the day. - I suggested to:  Patient Instructions  Please continue: - Metformin 1000 mg 2x a day - Invokana 100 mg daily before b'fast - Glipizide 5-10 mg 2x a day before meals, but skip the dose after an active day or if you have a small meal  Please return in 4 months with your sugar log.   - we will check her HbA1c when she returns to the clinic - continue checking sugars at different times of the day - check 1x a day, rotating checks - advised for yearly eye exams >> she is UTD - Return to clinic in 3-4 mo with sugar log      2.  H/o yeast vaginitis -Continues on terconazole as needed -No exacerbations  3. HL - Reviewed latest lipid panel from the end of last year: LDL at goal, as are the rest of her lipid fractions Lab Results  Component Value Date   CHOL 150 01/21/2018   HDL 44.00 01/21/2018   LDLCALC 86 01/21/2018   LDLDIRECT 120.9 04/15/2012   TRIG 102.0 01/21/2018   CHOLHDL 3 01/21/2018  - Continues Zocor without side effects.  4.  Obesity -Continue SGLT 2 inhibitor, which should also help with weight loss -No significant weight gain since last visit  Philemon Kingdom, MD PhD [facility] Endocrinology

## 2018-10-07 NOTE — Patient Instructions (Signed)
Please continue: - Metformin 1000 mg 2x a day - Invokana 100 mg daily before b'fast - Glipizide 5-10 mg 2x a day before meals, but skip the dose after an active day or if you have a small meal  Please return in 4 months with your sugar log.

## 2018-11-04 ENCOUNTER — Telehealth (INDEPENDENT_AMBULATORY_CARE_PROVIDER_SITE_OTHER): Payer: BC Managed Care – PPO | Admitting: Family Medicine

## 2018-11-04 ENCOUNTER — Other Ambulatory Visit: Payer: Self-pay

## 2018-11-04 ENCOUNTER — Encounter: Payer: Self-pay | Admitting: Family Medicine

## 2018-11-04 VITALS — HR 86 | Temp 101.0°F

## 2018-11-04 DIAGNOSIS — R6889 Other general symptoms and signs: Secondary | ICD-10-CM | POA: Diagnosis not present

## 2018-11-04 DIAGNOSIS — R05 Cough: Secondary | ICD-10-CM | POA: Diagnosis not present

## 2018-11-04 DIAGNOSIS — Z20822 Contact with and (suspected) exposure to covid-19: Secondary | ICD-10-CM

## 2018-11-04 DIAGNOSIS — R059 Cough, unspecified: Secondary | ICD-10-CM

## 2018-11-04 MED ORDER — BENZONATATE 100 MG PO CAPS: 100.0000 mg | ORAL_CAPSULE | Freq: Three times a day (TID) | ORAL | 0 refills | Status: AC | PRN

## 2018-11-04 NOTE — Patient Instructions (Signed)
Follow up: in 2-3 days with PCP (or Dr. Maudie Mercury)    Self Isolation/Home Quarantine: -see the CDC site for information:   RunningShows.co.za.html   -STAY HOME except for to seek medical care -stay in your own room away from others in your house and use a separate bathroom if possible -Wash hands frequently, wear a mask if you leave your room and interact as little as possible with others -seek medical care immediately if worsening - call our office for a visit or call ahead if going elsewhere to an urgent care  -seek emergency care if very sick or severe symptoms - call 911 -isolate for at least 10 days from the onset of symptoms PLUS 3 days of no fever PLUS 3 days of improving symptoms    Novel Coronavirus Testing: I sent an order for coronavirus testing. No Appointment is needed.  Testing Sites:    [facility] Location:                            [address], [place] (old [facility]) Hours:                                 8a-3:45p, M-F  [facility] Location:                           [address], [place], [place] [address]                                                              [place] ([place]) Hours:                                 8a-3:45p, M-F  [facility] Location:                            [facility], dispensing (across from [place]) Hours:                                 8a-3:45p, M-F  Positive test. These tests are not 100% perfect, but if you tested positive for COVID-19, this confirms that you have contracted the SARS-CoV-2 virus. STAY HOME to complete full Quarantine per CDC guidelines.  Negative test. These tests are not 100% perfect but if you tested negative for COVID-19, this indicates that you may not have contracted the SARS-CoV-2 virus. Follow your doctor's recommendations and the CDC  guidelines.

## 2018-11-04 NOTE — Progress Notes (Signed)
Appt scheduled for 8/7 at 10:30am.

## 2018-11-04 NOTE — Progress Notes (Signed)
Virtual Visit via Video Note  I connected with Monica Cunningham  on 11/04/18 at 11:40 AM EDT by a video enabled telemedicine application and verified that I am speaking with the correct person using two identifiers.  Location patient: home Location provider:work or home office Persons participating in the virtual visit: patient, provider  I discussed the limitations of evaluation and management by telemedicine and the availability of in person appointments. The patient expressed understanding and agreed to proceed.   HPI:  Acute visit for sore throat: -started 3 days ago -symptoms sore throat, runny nose, HA, fever of 101.3 last night, chills, cough -temp 100.4 now today , O2 sat is 95 (she reports that is normal for her) -denies body aches, SOB, diarrhea, vomiting, loss of taste or smell, inability to get out of bed -her daughter was in [place] moving into school and then was back at her house staying with her - came back about 1 week ago, her boyfriend was with her as well -she otherwise has been careful around other, she works from home -no known sick contacts but she is worried about COVID19 given daughter's recent travel and her symptoms -she did have a tick bite 2.5 weeks in [place], not a known deer tick, attached < 72 hours   ROS: See pertinent positives and negatives per HPI.  Past Medical History:  Diagnosis Date  . ALLERGIC RHINITIS 09/15/2007  . DM (diabetes mellitus) ([place])    sees Dr. Cruzita Lederer   . Endometriosis   . Essential hypertension, benign 01/26/2007  . Fibroid   . HYPERLIPIDEMIA 09/15/2007  . Insomnia   . Obesity   . PCOS (polycystic ovarian syndrome)   . POLYCYSTIC OVARIAN DISEASE 09/15/2007  . Rosacea     Past Surgical History:  Procedure Laterality Date  . ABDOMINAL HYSTERECTOMY    . CESAREAN SECTION     x2  . CHOLECYSTECTOMY    . COLONOSCOPY  01/21/2009   per Dr. Carlean Purl, clear, repeat in 10 yrs   . PELVIC LAPAROSCOPY    . REDUCTION MAMMAPLASTY Bilateral    . REFRACTIVE SURGERY    . TUBAL LIGATION    . tummy tuck      Family History  Problem Relation Age of Onset  . Hypertension Mother   . Diabetes Mother   . Colon polyps Father   . Hypertension Father   . Heart disease Father   . Heart disease Maternal Grandmother   . Diabetes Sister        gestational  . Hypertension Sister   . Hypertension Brother   . Heart disease Maternal Grandfather   . Cancer Paternal Grandmother        Kidney cancer  . Kidney cancer Paternal Grandmother   . Breast cancer Neg Hx     SOCIAL HX: see hpi   Current Outpatient Medications:  .  aspirin EC 81 MG tablet, Take 81 mg by mouth daily., Disp: , Rfl:  .  Azelaic Acid (FINACEA EX), Apply topically., Disp: , Rfl:  .  canagliflozin (INVOKANA) 100 MG TABS tablet, TAKE 1 TABLET BY MOUTH ONCE DAILY BEFORE BREAKFAST, Disp: 30 tablet, Rfl: 11 .  clonazePAM (KLONOPIN) 1 MG tablet, TAKE 1 TABLET BY MOUTH ONCE DAILY AS NEEDED, Disp: 90 tablet, Rfl: 2 .  fluconazole (DIFLUCAN) 100 MG tablet, TAKE 1 TABLET BY MOUTH ONCE DAILY, Disp: 3 tablet, Rfl: 3 .  glipiZIDE (GLUCOTROL) 5 MG tablet, TAKE 1-2 TABLETs BY MOUTH TWICE DAILY BEFORE A MEAL, Disp: 360 tablet, Rfl: 3 .  glucose blood (CONTOUR NEXT TEST) test strip, USE UP TO 8 TIMES A DAY, Disp: 300 each, Rfl: 11 .  losartan-hydrochlorothiazide (HYZAAR) 100-25 MG tablet, TAKE ONE TABLET BY MOUTH once DAILY, Disp: 90 tablet, Rfl: 4 .  metFORMIN (GLUCOPHAGE) 1000 MG tablet, Take 2 tablets (2,000 mg total) by mouth daily with supper., Disp: 180 tablet, Rfl: 3 .  metroNIDAZOLE (METROGEL) 1 % gel, Apply topically daily., Disp: 45 g, Rfl: 10 .  montelukast (SINGULAIR) 10 MG tablet, Take 1 tablet (10 mg total) by mouth daily., Disp: 90 tablet, Rfl: 3 .  simvastatin (ZOCOR) 40 MG tablet, Take 1 tablet (40 mg total) by mouth at bedtime., Disp: 90 tablet, Rfl: 3 .  Sulfacetamide Sodium-Sulfur 10-5 % EMUL, Apply small amounts once daily, Disp: 227 g, Rfl: 4 .  terconazole  (TERAZOL 3) 0.8 % vaginal cream, Place 1 applicator vaginally at bedtime., Disp: 20 g, Rfl: 11 .  benzonatate (TESSALON PERLES) 100 MG capsule, Take 1 capsule (100 mg total) by mouth 3 (three) times daily as needed., Disp: 20 capsule, Rfl: 0  EXAM:  VITALS per patient if applicable: temp 638.4  GENERAL: alert, oriented, appears well and in no acute distress  HEENT: atraumatic, conjunttiva clear, no obvious abnormalities on inspection of external nose and ears  NECK: normal movements of the head and neck  LUNGS: on inspection no signs of respiratory distress, breathing rate appears normal, no obvious gross SOB, gasping or wheezing  CV: no obvious cyanosis  MS: moves all visible extremities without noticeable abnormality  PSYCH/NEURO: pleasant and cooperative, no obvious depression or anxiety, speech and thought processing grossly intact  ASSESSMENT AND PLAN:  Discussed the following assessment and plan:  Cough - Plan: Novel Coronavirus, NAA ([facility]),  -we discussed possible serious and likely etiologies, workup and treatment, treatment risks and precautions. susect viral illness, possible COVID19 vs other. Tickborne illness much less likely given resp symptoms, lots of nasal congestion and does not meet criteria for empiric tx, though low threshhold to treat with doxy if worsening or not improving. Diabetes well controlled. -after this discussion, Jemiah opted for COVID 19 testing, symptomatic care, vit d, c, tessalon, home isolation, close follow up -follow up advised in 2-3 days -of course, we advised Merrit  to return or notify a doctor immediately if symptoms worsen or persist or new concerns arise.  I discussed the assessment and treatment plan with the patient. The patient was provided an opportunity to ask questions and all were answered. The patient agreed with the plan and demonstrated an understanding of the instructions.   Follow up instructions: Advised assistant Wendie Simmer  to help patient arrange the following: -follow up with PCP (or me) in 2-3 days  Lucretia Kern, DO   Patient Instructions  Follow up: in 2-3 days with PCP (or Dr. Maudie Mercury)    Self Isolation/Home Quarantine: -see the CDC site for information:   [email or web address].co.za.html   -STAY HOME except for to seek medical care -stay in your own room away from others in your house and use a separate bathroom if possible -Wash hands frequently, wear a mask if you leave your room and interact as little as possible with others -seek medical care immediately if worsening - call our office for a visit or call ahead if going elsewhere to an urgent care  -seek emergency care if very sick or severe symptoms - call 911 -isolate for at least 10 days from the onset of symptoms PLUS 3 days of no fever  PLUS 3 days of improving symptoms    Novel Coronavirus Testing: I sent an order for coronavirus testing. No Appointment is needed.  Testing Sites:    [facility] Location:                            [address], [place] (old [facility]) Hours:                                 8a-3:45p, M-F  [facility] Location:                           [address] [place], [place] [address]                                                              [place] ([place]) Hours:                                 8a-3:45p, M-F  [facility] Location:                            Optician, dispensing (across from [place]) Hours:                                 8a-3:45p, M-F  Positive test. These tests are not 100% perfect, but if you tested positive for COVID-19, this confirms that you have contracted the SARS-CoV-2 virus. STAY HOME to complete full Quarantine per CDC guidelines.  Negative test. These tests are not 100% perfect but if you tested negative for  COVID-19, this indicates that you may not have contracted the SARS-CoV-2 virus. Follow your doctor's recommendations and the CDC guidelines.

## 2018-11-05 ENCOUNTER — Ambulatory Visit: Payer: BLUE CROSS/BLUE SHIELD | Admitting: Neurology

## 2018-11-05 LAB — NOVEL CORONAVIRUS, NAA: SARS-CoV-2, NAA: NOT DETECTED

## 2018-11-07 ENCOUNTER — Other Ambulatory Visit: Payer: Self-pay

## 2018-11-07 ENCOUNTER — Telehealth (INDEPENDENT_AMBULATORY_CARE_PROVIDER_SITE_OTHER): Payer: BC Managed Care – PPO | Admitting: Family Medicine

## 2018-11-07 ENCOUNTER — Encounter: Payer: Self-pay | Admitting: Family Medicine

## 2018-11-07 DIAGNOSIS — B349 Viral infection, unspecified: Secondary | ICD-10-CM | POA: Diagnosis not present

## 2018-11-07 MED ORDER — HYDROCODONE-ACETAMINOPHEN 5-325 MG PO TABS: 1.0000 | ORAL_TABLET | Freq: Four times a day (QID) | ORAL | 0 refills | Status: AC | PRN

## 2018-11-07 NOTE — Progress Notes (Signed)
Virtual Visit via Video Note  I connected with the patient on 11/07/18 at 10:30 AM EDT by a video enabled telemedicine application and verified that I am speaking with the correct person using two identifiers.  Location patient: home Location provider:work or home office Persons participating in the virtual visit: patient, provider  I discussed the limitations of evaluation and management by telemedicine and the availability of in person appointments. The patient expressed understanding and agreed to proceed.   HPI: Here to follow up on a viral illness. One week ago she developed fever to 101 degrees, headache, body aches, ST, and a dry cough. She also reports a tick bite in the vaginal area about 3 weeks ago. She saw Dr. Maudie Mercury on 11-04-18 and they felt her symptoms were viral. She was tested for the Covid-19 virus and this was negative. She has been drinking fluids, taking Ibuprofen, and quarantining herself at home. Of note her daughter and her daughter's boyfriend came from [place] several weeks ago and they spent a few days with [name]. [name] says neither of them have had any symptoms. Today she feels better in general in that the body aches and ST are gone, the cough has improved, and the fever is down to 99.2 degrees. No NVD. She still has a stubborn headache.    ROS: See pertinent positives and negatives per HPI.  Past Medical History:  Diagnosis Date  . ALLERGIC RHINITIS 09/15/2007  . DM (diabetes mellitus) ([place])    sees Dr. Cruzita Lederer   . Endometriosis   . Essential hypertension, benign 01/26/2007  . Fibroid   . HYPERLIPIDEMIA 09/15/2007  . Insomnia   . Obesity   . PCOS (polycystic ovarian syndrome)   . POLYCYSTIC OVARIAN DISEASE 09/15/2007  . Rosacea     Past Surgical History:  Procedure Laterality Date  . ABDOMINAL HYSTERECTOMY    . CESAREAN SECTION     x2  . CHOLECYSTECTOMY    . COLONOSCOPY  01/21/2009   per Dr. Carlean Purl, clear, repeat in 10 yrs   . PELVIC LAPAROSCOPY    .  REDUCTION MAMMAPLASTY Bilateral   . REFRACTIVE SURGERY    . TUBAL LIGATION    . tummy tuck      Family History  Problem Relation Age of Onset  . Hypertension Mother   . Diabetes Mother   . Colon polyps Father   . Hypertension Father   . Heart disease Father   . Heart disease Maternal Grandmother   . Diabetes Sister        gestational  . Hypertension Sister   . Hypertension Brother   . Heart disease Maternal Grandfather   . Cancer Paternal Grandmother        Kidney cancer  . Kidney cancer Paternal Grandmother   . Breast cancer Neg Hx      Current Outpatient Medications:  .  aspirin EC 81 MG tablet, Take 81 mg by mouth daily., Disp: , Rfl:  .  Azelaic Acid (FINACEA EX), Apply topically., Disp: , Rfl:  .  benzonatate (TESSALON PERLES) 100 MG capsule, Take 1 capsule (100 mg total) by mouth 3 (three) times daily as needed., Disp: 20 capsule, Rfl: 0 .  canagliflozin (INVOKANA) 100 MG TABS tablet, TAKE 1 TABLET BY MOUTH ONCE DAILY BEFORE BREAKFAST, Disp: 30 tablet, Rfl: 11 .  clonazePAM (KLONOPIN) 1 MG tablet, TAKE 1 TABLET BY MOUTH ONCE DAILY AS NEEDED, Disp: 90 tablet, Rfl: 2 .  fluconazole (DIFLUCAN) 100 MG tablet, TAKE 1 TABLET BY MOUTH  ONCE DAILY, Disp: 3 tablet, Rfl: 3 .  glipiZIDE (GLUCOTROL) 5 MG tablet, TAKE 1-2 TABLETs BY MOUTH TWICE DAILY BEFORE A MEAL, Disp: 360 tablet, Rfl: 3 .  glucose blood (CONTOUR NEXT TEST) test strip, USE UP TO 8 TIMES A DAY, Disp: 300 each, Rfl: 11 .  HYDROcodone-acetaminophen (NORCO) 5-325 MG tablet, Take 1 tablet by mouth every 6 (six) hours as needed for up to 5 days for moderate pain., Disp: 20 tablet, Rfl: 0 .  losartan-hydrochlorothiazide (HYZAAR) 100-25 MG tablet, TAKE ONE TABLET BY MOUTH once DAILY, Disp: 90 tablet, Rfl: 4 .  metFORMIN (GLUCOPHAGE) 1000 MG tablet, Take 2 tablets (2,000 mg total) by mouth daily with supper., Disp: 180 tablet, Rfl: 3 .  metroNIDAZOLE (METROGEL) 1 % gel, Apply topically daily., Disp: 45 g, Rfl: 10 .   montelukast (SINGULAIR) 10 MG tablet, Take 1 tablet (10 mg total) by mouth daily., Disp: 90 tablet, Rfl: 3 .  simvastatin (ZOCOR) 40 MG tablet, Take 1 tablet (40 mg total) by mouth at bedtime., Disp: 90 tablet, Rfl: 3 .  Sulfacetamide Sodium-Sulfur 10-5 % EMUL, Apply small amounts once daily, Disp: 227 g, Rfl: 4 .  terconazole (TERAZOL 3) 0.8 % vaginal cream, Place 1 applicator vaginally at bedtime., Disp: 20 g, Rfl: 11  EXAM:  VITALS per patient if applicable:  GENERAL: alert, oriented, appears well and in no acute distress  HEENT: atraumatic, conjunttiva clear, no obvious abnormalities on inspection of external nose and ears  NECK: normal movements of the head and neck  LUNGS: on inspection no signs of respiratory distress, breathing rate appears normal, no obvious gross SOB, gasping or wheezing  CV: no obvious cyanosis  MS: moves all visible extremities without noticeable abnormality  PSYCH/NEURO: pleasant and cooperative, no obvious depression or anxiety, speech and thought processing grossly intact  ASSESSMENT AND PLAN: Viral illness. Despite the negative test, I believe this is more than likely a Covid-19 infection. She will remain quarantined for another week (a total of 14 days). She can use Norco for the headaches. Recheck prn.  Monica Penna, MD  Discussed the following assessment and plan:  No diagnosis found.     I discussed the assessment and treatment plan with the patient. The patient was provided an opportunity to ask questions and all were answered. The patient agreed with the plan and demonstrated an understanding of the instructions.   The patient was advised to call back or seek an in-person evaluation if the symptoms worsen or if the condition fails to improve as anticipated.

## 2018-11-10 ENCOUNTER — Encounter: Payer: Self-pay | Admitting: Family Medicine

## 2018-11-10 DIAGNOSIS — Z209 Contact with and (suspected) exposure to unspecified communicable disease: Secondary | ICD-10-CM

## 2018-11-10 DIAGNOSIS — R300 Dysuria: Secondary | ICD-10-CM

## 2018-11-10 NOTE — Telephone Encounter (Signed)
She can have this drawn here at our lab. I put in the order. She just needs to make a lab appt

## 2018-11-10 NOTE — Telephone Encounter (Signed)
I also ordered the urine tests

## 2018-11-12 ENCOUNTER — Other Ambulatory Visit (INDEPENDENT_AMBULATORY_CARE_PROVIDER_SITE_OTHER): Payer: BC Managed Care – PPO

## 2018-11-12 ENCOUNTER — Other Ambulatory Visit: Payer: Self-pay

## 2018-11-12 DIAGNOSIS — Z209 Contact with and (suspected) exposure to unspecified communicable disease: Secondary | ICD-10-CM

## 2018-11-12 DIAGNOSIS — R300 Dysuria: Secondary | ICD-10-CM | POA: Diagnosis not present

## 2018-11-12 LAB — POC URINALSYSI DIPSTICK (AUTOMATED)
Glucose, UA: POSITIVE — AB
Ketones, UA: NEGATIVE
Leukocytes, UA: NEGATIVE
Nitrite, UA: NEGATIVE
Protein, UA: NEGATIVE
Spec Grav, UA: 1.025 (ref 1.010–1.025)
Urobilinogen, UA: 0.2 E.U./dL
pH, UA: 5.5 (ref 5.0–8.0)

## 2018-11-12 NOTE — Telephone Encounter (Signed)
Spoke with the patient via phone. Lab appointment has been scheduled. Nothing further needed.

## 2018-11-14 ENCOUNTER — Encounter: Payer: Self-pay | Admitting: Family Medicine

## 2018-11-14 LAB — URINE CULTURE
MICRO NUMBER:: 764010
SPECIMEN QUALITY:: ADEQUATE

## 2018-11-14 LAB — SAR COV2 SEROLOGY (COVID19)AB(IGG),IA: SARS CoV2 AB IGG: NEGATIVE

## 2018-11-17 MED ORDER — NITROFURANTOIN MONOHYD MACRO 100 MG PO CAPS: 100.0000 mg | ORAL_CAPSULE | Freq: Two times a day (BID) | ORAL | 0 refills | Status: DC

## 2018-11-17 NOTE — Telephone Encounter (Signed)
she could take an antibiotic empirically to see if it helps. Call in [place] 100 mg bid for 7 days

## 2018-12-01 ENCOUNTER — Other Ambulatory Visit: Payer: Self-pay

## 2018-12-01 DIAGNOSIS — E119 Type 2 diabetes mellitus without complications: Secondary | ICD-10-CM

## 2018-12-01 MED ORDER — SIMVASTATIN 40 MG PO TABS: 40.0000 mg | ORAL_TABLET | Freq: Every day | ORAL | 0 refills | Status: DC

## 2018-12-31 ENCOUNTER — Other Ambulatory Visit: Payer: Self-pay | Admitting: Family Medicine

## 2018-12-31 ENCOUNTER — Encounter: Payer: Self-pay | Admitting: Family Medicine

## 2018-12-31 DIAGNOSIS — Z1231 Encounter for screening mammogram for malignant neoplasm of breast: Secondary | ICD-10-CM

## 2019-02-22 ENCOUNTER — Encounter: Payer: Self-pay | Admitting: Internal Medicine

## 2019-03-01 ENCOUNTER — Other Ambulatory Visit: Payer: Self-pay | Admitting: Family Medicine

## 2019-03-04 ENCOUNTER — Encounter: Payer: Self-pay | Admitting: Family Medicine

## 2019-03-04 NOTE — Telephone Encounter (Signed)
Last OV 11/07/18 Last refill 06/06/18 # 90/2 Next OV not scheduled

## 2019-03-06 ENCOUNTER — Telehealth: Payer: BC Managed Care – PPO | Admitting: Physician Assistant

## 2019-03-06 DIAGNOSIS — R300 Dysuria: Secondary | ICD-10-CM

## 2019-03-06 MED ORDER — CEPHALEXIN 500 MG PO CAPS: 500.0000 mg | ORAL_CAPSULE | Freq: Two times a day (BID) | ORAL | 0 refills | Status: AC

## 2019-03-06 NOTE — Progress Notes (Signed)
I have spent 5 minutes in review of e-visit questionnaire, review and updating patient chart, medical decision making and response to patient.   Americus Perkey Cody Solveig Fangman, PA-C    

## 2019-03-06 NOTE — Progress Notes (Signed)

## 2019-03-09 ENCOUNTER — Telehealth: Payer: Self-pay | Admitting: Family Medicine

## 2019-03-09 DIAGNOSIS — I10 Essential (primary) hypertension: Secondary | ICD-10-CM

## 2019-03-09 DIAGNOSIS — E119 Type 2 diabetes mellitus without complications: Secondary | ICD-10-CM

## 2019-03-09 MED ORDER — LOSARTAN POTASSIUM-HCTZ 100-25 MG PO TABS: ORAL_TABLET | ORAL | 4 refills | Status: AC

## 2019-03-09 MED ORDER — MONTELUKAST SODIUM 10 MG PO TABS: 10.0000 mg | ORAL_TABLET | Freq: Every day | ORAL | 3 refills | Status: AC

## 2019-03-09 NOTE — Telephone Encounter (Signed)
RX sent to pharmacy  

## 2019-03-09 NOTE — Telephone Encounter (Signed)
Patient is requesting refills on Montelukast and Losartan/Hctz.  Patient has an appt for a physical on 03/20/19.  Pharmacy- [facility] on [address]

## 2019-03-19 ENCOUNTER — Other Ambulatory Visit: Payer: Self-pay

## 2019-03-20 ENCOUNTER — Ambulatory Visit (INDEPENDENT_AMBULATORY_CARE_PROVIDER_SITE_OTHER): Payer: BC Managed Care – PPO | Admitting: Family Medicine

## 2019-03-20 ENCOUNTER — Encounter: Payer: Self-pay | Admitting: Family Medicine

## 2019-03-20 VITALS — BP 130/70 | HR 108 | Temp 97.6°F | Wt 172.8 lb

## 2019-03-20 DIAGNOSIS — N390 Urinary tract infection, site not specified: Secondary | ICD-10-CM

## 2019-03-20 DIAGNOSIS — Z0000 Encounter for general adult medical examination without abnormal findings: Secondary | ICD-10-CM | POA: Diagnosis not present

## 2019-03-20 DIAGNOSIS — E1165 Type 2 diabetes mellitus with hyperglycemia: Secondary | ICD-10-CM

## 2019-03-20 DIAGNOSIS — E119 Type 2 diabetes mellitus without complications: Secondary | ICD-10-CM | POA: Diagnosis not present

## 2019-03-20 DIAGNOSIS — L989 Disorder of the skin and subcutaneous tissue, unspecified: Secondary | ICD-10-CM

## 2019-03-20 LAB — URINALYSIS
Bilirubin Urine: NEGATIVE
Ketones, ur: NEGATIVE
Leukocytes,Ua: NEGATIVE
Nitrite: NEGATIVE
Specific Gravity, Urine: 1.025 (ref 1.000–1.030)
Total Protein, Urine: NEGATIVE
Urine Glucose: >=1000 — AB
Urobilinogen, UA: 0.2 (ref 0.0–1.0)
pH: 5.5 (ref 5.0–8.0)

## 2019-03-20 LAB — LIPID PANEL
Cholesterol: 145 mg/dL (ref 0–200)
HDL: 44.5 mg/dL (ref >39.00)
LDL Cholesterol: 72 mg/dL (ref 0–99)
NonHDL: 100.24
Total CHOL/HDL Ratio: 3
Triglycerides: 141 mg/dL (ref 0.0–149.0)
VLDL: 28.2 mg/dL (ref 0.0–40.0)

## 2019-03-20 LAB — CBC WITH DIFFERENTIAL/PLATELET
Basophils Absolute: 0.1 10*3/uL (ref 0.0–0.1)
Basophils Relative: 1.3 % (ref 0.0–3.0)
Eosinophils Absolute: 0.2 10*3/uL (ref 0.0–0.7)
Eosinophils Relative: 2.4 % (ref 0.0–5.0)
HCT: 48.2 % — ABNORMAL HIGH (ref 36.0–46.0)
Hemoglobin: 16.1 g/dL — ABNORMAL HIGH (ref 12.0–15.0)
Lymphocytes Relative: 32.7 % (ref 12.0–46.0)
Lymphs Abs: 2.1 10*3/uL (ref 0.7–4.0)
MCHC: 33.3 g/dL (ref 30.0–36.0)
MCV: 96.6 fl (ref 78.0–100.0)
Monocytes Absolute: 0.6 10*3/uL (ref 0.1–1.0)
Monocytes Relative: 9 % (ref 3.0–12.0)
Neutro Abs: 3.6 10*3/uL (ref 1.4–7.7)
Neutrophils Relative %: 54.6 % (ref 43.0–77.0)
Platelets: 183 10*3/uL (ref 150.0–400.0)
RBC: 4.99 Mil/uL (ref 3.87–5.11)
RDW: 13 % (ref 11.5–15.5)
WBC: 6.5 10*3/uL (ref 4.0–10.5)

## 2019-03-20 LAB — HEPATIC FUNCTION PANEL
ALT: 54 U/L — ABNORMAL HIGH (ref 0–35)
AST: 36 U/L (ref 0–37)
Albumin: 4.7 g/dL (ref 3.5–5.2)
Alkaline Phosphatase: 57 U/L (ref 39–117)
Bilirubin, Direct: 0.2 mg/dL (ref 0.0–0.3)
Total Bilirubin: 1.6 mg/dL — ABNORMAL HIGH (ref 0.2–1.2)
Total Protein: 6.9 g/dL (ref 6.0–8.3)

## 2019-03-20 LAB — BASIC METABOLIC PANEL
BUN: 19 mg/dL (ref 6–23)
CO2: 27 mEq/L (ref 19–32)
Calcium: 9.8 mg/dL (ref 8.4–10.5)
Chloride: 103 mEq/L (ref 96–112)
Creatinine, Ser: 0.88 mg/dL (ref 0.40–1.20)
GFR: 66.47 mL/min (ref >60.00)
Glucose, Bld: 154 mg/dL — ABNORMAL HIGH (ref 70–99)
Potassium: 4 mEq/L (ref 3.5–5.1)
Sodium: 139 mEq/L (ref 135–145)

## 2019-03-20 LAB — HEMOGLOBIN A1C: Hgb A1c MFr Bld: 6.5 % (ref 4.6–6.5)

## 2019-03-20 LAB — TSH: TSH: 3.03 u[IU]/mL (ref 0.35–4.50)

## 2019-03-20 MED ORDER — CIPROFLOXACIN HCL 500 MG PO TABS: 500.0000 mg | ORAL_TABLET | Freq: Two times a day (BID) | ORAL | 0 refills | Status: AC

## 2019-03-20 MED ORDER — SIMVASTATIN 40 MG PO TABS: 40.0000 mg | ORAL_TABLET | Freq: Every day | ORAL | 3 refills | Status: AC

## 2019-03-20 MED ORDER — AZELAIC ACID 15 % EX GEL: 1.0000 "application " | Freq: Two times a day (BID) | CUTANEOUS | 5 refills | Status: AC

## 2019-03-20 NOTE — Progress Notes (Signed)
Subjective:    Patient ID: Monica Cunningham, female    DOB: Jan 17, 1961, 58 y.o.   MRN: [id number]  HPI Here for a well exam. She thinks she still has a UTI. On 03-06-19 she had an e visit for urinary burning and urgency, and she was felt to have a UTI. She was treated with 7 days of Keflex, but the symptoms remain. She is drinking plenty of water. No fever. Otherwise she was due for another colonoscopy this year but she did not hear from the GI office. She asks me to check 2 skin lesions, one on each forearm. She wants the one on the right forearm removed because it causes discomfort when she rests her arm on a table or desk top to write or type. She sees Dr. Cruzita Lederer for her diabetes. Her last A1c was over a year ago.    Review of Systems  Constitutional: Negative.   HENT: Negative.   Eyes: Negative.   Respiratory: Negative.   Cardiovascular: Negative.   Gastrointestinal: Negative.   Genitourinary: Positive for dysuria and frequency. Negative for decreased urine volume, difficulty urinating, dyspareunia, enuresis, flank pain, hematuria, pelvic pain and urgency.  Musculoskeletal: Negative.   Skin: Negative.   Neurological: Negative.   Psychiatric/Behavioral: Negative.        Objective:   Physical Exam Constitutional:      General: She is not in acute distress.    Appearance: She is well-developed.  HENT:     Head: Normocephalic and atraumatic.     Right Ear: External ear normal.     Left Ear: External ear normal.     Nose: Nose normal.     Mouth/Throat:     Pharynx: No oropharyngeal exudate.  Eyes:     General: No scleral icterus.    Conjunctiva/sclera: Conjunctivae normal.     Pupils: Pupils are equal, round, and reactive to light.  Neck:     Thyroid: No thyromegaly.     Vascular: No JVD.  Cardiovascular:     Rate and Rhythm: Normal rate and regular rhythm.     Heart sounds: Normal heart sounds. No murmur. No friction rub. No gallop.   Pulmonary:     Effort:  Pulmonary effort is normal. No respiratory distress.     Breath sounds: Normal breath sounds. No wheezing or rales.  Chest:     Chest wall: No tenderness.  Abdominal:     General: Bowel sounds are normal. There is no distension.     Palpations: Abdomen is soft. There is no mass.     Tenderness: There is no abdominal tenderness. There is no guarding or rebound.  Musculoskeletal:        General: No tenderness. Normal range of motion.     Cervical back: Normal range of motion and neck supple.  Lymphadenopathy:     Cervical: No cervical adenopathy.  Skin:    General: Skin is warm and dry.     Findings: No erythema or rash.     Comments: There is a small dermatofibroma on the right forearm. There is a tiny macular dark lesion on the left forearm that may be a blue nevus.   Neurological:     Mental Status: She is alert and oriented to person, place, and time.     Cranial Nerves: No cranial nerve deficit.     Motor: No abnormal muscle tone.     Coordination: Coordination normal.     Deep Tendon Reflexes: Reflexes are normal and  symmetric. Reflexes normal.  Psychiatric:        Behavior: Behavior normal.        Thought Content: Thought content normal.        Judgment: Judgment normal.           Assessment & Plan:  Well exam. We discussed diet and exercise. Get fasting labs including an A1c. Treat the UTI with Cipro and culture the sample. Refer to Dermatology for the skin lesions. We will have the GI office contact her about another colonoscopy. She has a mammogram set up for January.  Alysia Penna, MD

## 2019-03-21 LAB — URINE CULTURE
MICRO NUMBER:: 1213043
SPECIMEN QUALITY:: ADEQUATE

## 2019-03-23 ENCOUNTER — Ambulatory Visit
Admission: RE | Admit: 2019-03-23 | Discharge: 2019-03-23 | Disposition: A | Discharge status: Home / Self Care | Payer: BC Managed Care – PPO | Source: Ambulatory Visit | Attending: Family Medicine | Admitting: Family Medicine

## 2019-03-23 ENCOUNTER — Other Ambulatory Visit: Payer: Self-pay

## 2019-03-23 DIAGNOSIS — Z1231 Encounter for screening mammogram for malignant neoplasm of breast: Secondary | ICD-10-CM | POA: Diagnosis not present

## 2019-03-30 DIAGNOSIS — Z20828 Contact with and (suspected) exposure to other viral communicable diseases: Secondary | ICD-10-CM | POA: Diagnosis not present

## 2019-04-07 ENCOUNTER — Encounter: Payer: Self-pay | Admitting: Family Medicine

## 2019-04-07 DIAGNOSIS — N760 Acute vaginitis: Secondary | ICD-10-CM

## 2019-04-07 NOTE — Telephone Encounter (Signed)
Please advise.  Ok to fill? 

## 2019-04-08 MED ORDER — TERCONAZOLE 0.8 % VA CREA: 1.0000 | TOPICAL_CREAM | Freq: Every day | VAGINAL | 5 refills | Status: AC

## 2019-04-08 MED ORDER — FLUCONAZOLE 150 MG PO TABS: ORAL_TABLET | ORAL | 5 refills | Status: AC

## 2019-04-08 NOTE — Telephone Encounter (Signed)
I sent these in  

## 2019-05-29 ENCOUNTER — Other Ambulatory Visit: Payer: Self-pay | Admitting: Internal Medicine

## 2019-06-24 ENCOUNTER — Other Ambulatory Visit: Payer: Self-pay | Admitting: Internal Medicine

## 2019-06-26 ENCOUNTER — Encounter: Payer: Self-pay | Admitting: Internal Medicine

## 2019-06-29 ENCOUNTER — Other Ambulatory Visit: Payer: Self-pay

## 2019-06-29 ENCOUNTER — Encounter: Payer: Self-pay | Admitting: Internal Medicine

## 2019-06-29 ENCOUNTER — Ambulatory Visit (INDEPENDENT_AMBULATORY_CARE_PROVIDER_SITE_OTHER): Payer: BC Managed Care – PPO | Admitting: Internal Medicine

## 2019-06-29 DIAGNOSIS — Z6832 Body mass index (BMI) 32.0-32.9, adult: Secondary | ICD-10-CM

## 2019-06-29 DIAGNOSIS — E785 Hyperlipidemia, unspecified: Secondary | ICD-10-CM | POA: Diagnosis not present

## 2019-06-29 DIAGNOSIS — E1165 Type 2 diabetes mellitus with hyperglycemia: Secondary | ICD-10-CM | POA: Diagnosis not present

## 2019-06-29 DIAGNOSIS — E669 Obesity, unspecified: Secondary | ICD-10-CM | POA: Diagnosis not present

## 2019-06-29 MED ORDER — OZEMPIC (0.25 OR 0.5 MG/DOSE) 2 MG/1.5ML ~~LOC~~ SOPN: 0.5000 mg | PEN_INJECTOR | SUBCUTANEOUS | 5 refills | Status: DC

## 2019-06-29 NOTE — Progress Notes (Signed)
Patient ID: Monica Cunningham, female   DOB: Apr 25, 1960, 59 y.o.   MRN: [id number]   Patient location: Home My location: Office Persons participating in the virtual visit: patient, provider  Referring Provider: Laurey Morale, MD  I connected with the patient on 06/29/19 at  1:03 PM EDT by a video enabled telemedicine application and verified that I am speaking with the correct person.   I discussed the limitations of evaluation and management by telemedicine and the availability of in person appointments. The patient expressed understanding and agreed to proceed.   Details of the encounter are shown below.  HPI: Monica Cunningham is a 59 y.o.-year-old female, presenting for f/u for DM2, dx as GDM in 2003, then DM2 in 2005, non-insulin-dependent, uncontrolled, without long term complications. Last visit 9 months ago (virtual).  Reviewed HbA1c levels: Lab Results  Component Value Date   HGBA1C 6.5 03/20/2019   HGBA1C 6.7 (A) 06/03/2018   HGBA1C 6.9 (H) 01/22/2018   Pt is on a regimen of: - Metformin 2000 mg with dinner >> cramping, diarrhea >> 1000 mg 2x a day - Invokana 100 mg daily before b'fast - Glipizide 5-10 mg 2x a day before meals, but skip the dose after an active day or if you have a small meal  Pt checks her sugars twice a day: - am:  122-196, 219 >> 96, 140-160, 164 (late dinner) >> 125-156 (160-180 after the vaccine) - 2h after b'fast: 130-140s, 150s >> n/c  - before lunch:  93-142, 165 >> 126-170 >> 150s >> 140-150, 160 (160-180 after the vaccine) - 2h after lunch: 187, 272 >> 101-182 >> 90s-120 >> 90, 115-120 - before dinner:   90-130 >> 90-140 (snack) >> 150-170 - 2h after dinner: 90-110 or up to 170s, 200-pizza >> 140s - bedtime:  see above >> 42 >> n/c >> 114, 120-130 - nighttime: 51 x1, 60, 104-179 >> 91-203 >> same as above Lowest sugar was 42 at bedtime >> .Marland KitchenMarland Kitchen60s - 3x since last OV >> 46; she has hypoglycemia awareness in the 70s. Highest sugar was  272 >>... 200 >> 210.  Glucometer: [employer]  Pt's meals are: - Breakfast: chicken tenders and PB; 1 egg; prev. muffins - Lunch: meat + veggies or salad - Dinner: protein, veggies, pizza; hamburger - Snacks: 3-4, diet soda, unsweet tea  She is walking for exercise.  -No CKD, last BUN/creatinine:  Lab Results  Component Value Date   BUN 19 03/20/2019   BUN 20 01/21/2018   CREATININE 0.88 03/20/2019   CREATININE 0.83 01/21/2018  On losartan. -+ HL. Last set of lipids: Lab Results  Component Value Date   CHOL 145 03/20/2019   HDL 44.50 03/20/2019   LDLCALC 72 03/20/2019   LDLDIRECT 120.9 04/15/2012   TRIG 141.0 03/20/2019   CHOLHDL 3 03/20/2019  On Zocor. - last eye exam was in 04/2018: No DR -No numbness and tingling in her feet.  She also has a history of HTN, PCOS, headaches- on nadolol.  She has a history of TAH.  Her dog has Cushing's ds.  No PMH of pancreatitis. No FH of MTC.  ROS: Constitutional: no weight gain/+ weight loss, no fatigue, no subjective hyperthermia, no subjective hypothermia Eyes: no blurry vision, no xerophthalmia ENT: no sore throat, no nodules palpated in neck, no dysphagia, no odynophagia, no hoarseness Cardiovascular: no CP/no SOB/no palpitations/no leg swelling Respiratory: no cough/no SOB/no wheezing Gastrointestinal: no N/no V/no D/no C/no acid reflux Musculoskeletal: no muscle aches/no joint aches  Skin: no rashes, no hair loss Neurological: no tremors/no numbness/no tingling/no dizziness  I reviewed pt's medications, allergies, PMH, social hx, family hx, and changes were documented in the history of present illness. Otherwise, unchanged from my initial visit note.   Past Medical History:  Diagnosis Date  . ALLERGIC RHINITIS 09/15/2007  . DM (diabetes mellitus) ([place])    sees Dr. Cruzita Lederer   . Endometriosis   . Essential hypertension, benign 01/26/2007  . Fibroid   . HYPERLIPIDEMIA 09/15/2007  . Insomnia   . Obesity   . PCOS  (polycystic ovarian syndrome)   . POLYCYSTIC OVARIAN DISEASE 09/15/2007  . Rosacea    Past Surgical History:  Procedure Laterality Date  . ABDOMINAL HYSTERECTOMY    . CESAREAN SECTION     x2  . CHOLECYSTECTOMY    . COLONOSCOPY  01/21/2009   per Dr. Carlean Purl, clear, repeat in 10 yrs   . PELVIC LAPAROSCOPY    . REDUCTION MAMMAPLASTY Bilateral   . REFRACTIVE SURGERY    . TUBAL LIGATION    . tummy tuck     Social History   Social History  . Marital status: Divorced    Spouse name: N/A  . Number of children: 3   Occupational History  . Accountant    Social History Main Topics  . Smoking status: Former Smoker    Packs/day: 0.25    Quit date: 2013  . Smokeless tobacco: Never Used  . Alcohol use     1-2 Glasses of wine per week       . Drug use: No  . Sexual activity: Yes    Birth control/ protection: Surgical   Current Outpatient Medications on File Prior to Visit  Medication Sig Dispense Refill  . aspirin EC 81 MG tablet Take 81 mg by mouth daily.    . Azelaic Acid (FINACEA) 15 % cream Apply 1 application topically 2 (two) times daily. 50 g 5  . benzonatate (TESSALON PERLES) 100 MG capsule Take 1 capsule (100 mg total) by mouth 3 (three) times daily as needed. 20 capsule 0  . ciprofloxacin (CIPRO) 500 MG tablet Take 1 tablet (500 mg total) by mouth 2 (two) times daily. 28 tablet 0  . clonazePAM (KLONOPIN) 1 MG tablet TAKE 1 TABLET BY MOUTH ONCE DAILY AS NEEDED 90 tablet 1  . fluconazole (DIFLUCAN) 150 MG tablet Take all 3 tabs 72 hours apart 3 tablet 5  . glipiZIDE (GLUCOTROL) 5 MG tablet TAKE 1 TO 2 TABLETS BY MOUTH TWICE DAILY BEFORE A MEAL 360 tablet 0  . glucose blood (CONTOUR NEXT TEST) test strip USE UP TO 8 TIMES A DAY 300 each 11  . INVOKANA 100 MG TABS tablet TAKE 1 TABLET BY MOUTH ONCE DAILY BEFORE BREAKFAST 30 tablet 0  . losartan-hydrochlorothiazide (HYZAAR) 100-25 MG tablet TAKE ONE TABLET BY MOUTH once DAILY 90 tablet 4  . metFORMIN (GLUCOPHAGE) 1000 MG  tablet Take 2 tablets (2,000 mg total) by mouth daily with supper. 180 tablet 3  . montelukast (SINGULAIR) 10 MG tablet Take 1 tablet (10 mg total) by mouth daily. 90 tablet 3  . simvastatin (ZOCOR) 40 MG tablet Take 1 tablet (40 mg total) by mouth at bedtime. 90 tablet 3  . Sulfacetamide Sodium-Sulfur 10-5 % EMUL Apply small amounts once daily 227 g 4  . terconazole (TERAZOL 3) 0.8 % vaginal cream Place 1 applicator vaginally at bedtime. 20 g 5   No current facility-administered medications on file prior to visit.   Allergies  Allergen Reactions  . Codeine     REACTION: nausea   Family History  Problem Relation Age of Onset  . Hypertension Mother   . Diabetes Mother   . Colon polyps Father   . Hypertension Father   . Heart disease Father   . Heart disease Maternal Grandmother   . Diabetes Sister        gestational  . Hypertension Sister   . Hypertension Brother   . Heart disease Maternal Grandfather   . Cancer Paternal Grandmother        Kidney cancer  . Kidney cancer Paternal Grandmother   . Breast cancer Neg Hx     PE: There were no vitals taken for this visit. Wt Readings from Last 3 Encounters:  03/20/19 172 lb 12.8 oz (78.4 kg)  06/03/18 178 lb (80.7 kg)  04/15/18 180 lb (81.6 kg)   Constitutional:  in NAD  The physical exam was not performed (virtual visit).  ASSESSMENT: 1. DM2, non-insulin-dependent, uncontrolled, without long term complications, but with hyperglycemia  2. H/o yeast inf  3. HL  4.  Obesity  PLAN:  1. Patient with longstanding, previously uncontrolled type 2 diabetes, with much better control after adding Invokana and after she started to improve her diet and activity.  Latest HbA1c was even better than before, at 6.5% in 03/2019.  Our last visit was in 10/2018 and this was virtual.  At that time, sugars were better but she was still having hypoglycemic spikes after dietary indiscretions and we discussed about reducing these.  She could not  tolerate taking the entire metformin dose with dinner so she was splitting it into 2 doses a day.  We continued this.  She refused adding insulin at that time but wanted to try to work on her diet for another 3 to 4 months.  We discussed about skipping the dose of glipizide if she has a lighter meal or she is particularly active during the day since she had few low blood sugars. -At this visit, sugars are still above target, but they are especially high after she got her Covid vaccine 2 weeks ago.  Her sugars appear to be at goal after meals but they are higher than goal between meals.  We discussed about options of adding long-acting insulin or switching from sulfonylurea to a GLP-1 receptor agonist.  She agrees to try Ozempic.  Discussed about benefits and possible side effects.  Also discussed about to inject the medication.  I advised her that if insurance does not cover Ozempic, we can work with [facility]. -We will continue Metformin and Invokana for now but I advised her to stop glipizide approximately 1 week after she starts Ozempic - I suggested to:  Patient Instructions  Please continue: - Metformin 1000 mg 2x a day - Invokana 100 mg daily before b'fast  Please continue - Glipizide 5-10 mg 2x a day before meals Stop after 1 week afetr starting Ozempic.  Please start: - Ozempic 0.25 mg weekly in a.m. (for example on Sunday morning) x 4 weeks, then increase to 0.5 mg weekly in a.m. if no nausea or hypoglycemia.  Please return in 3 months with your sugar log.   - we will check her HbA1c when she returns to the clinic - advised to check sugars at different times of the day - 1x a day, rotating check times - advised for yearly eye exams >> she is UTD - return to clinic in 4 months  2.  H/o yeast vaginitis -On terconazole as needed -No exacerbations  3. HL -Reviewed latest lipid panel from 03/2019: Fractions at goal Lab Results  Component Value Date   CHOL 145 03/20/2019   HDL  44.50 03/20/2019   LDLCALC 72 03/20/2019   LDLDIRECT 120.9 04/15/2012   TRIG 141.0 03/20/2019   CHOLHDL 3 03/20/2019  -Continue Zocor without side effects  4.  Obesity -Continue SGLT 2 inhibitor which should also help with weight loss and we will also add a GLP-1 receptor agonist now -She lost 8 pounds last year  Philemon Kingdom, MD PhD [facility] Endocrin[facility]

## 2019-06-29 NOTE — Patient Instructions (Addendum)
Please continue: - Metformin 1000 mg 2x a day - Invokana 100 mg daily before b'fast  Please continue - Glipizide 5-10 mg 2x a day before meals Stop after 1 week afetr starting Ozempic.  Please start: - Ozempic 0.25 mg weekly in a.m. (for example on Sunday morning) x 4 weeks, then increase to 0.5 mg weekly in a.m. if no nausea or hypoglycemia.  Please return in 3 months with your sugar log.

## 2019-07-18 ENCOUNTER — Encounter: Payer: Self-pay | Admitting: Internal Medicine

## 2019-07-20 ENCOUNTER — Other Ambulatory Visit: Payer: Self-pay

## 2019-07-20 MED ORDER — CANAGLIFLOZIN 100 MG PO TABS: ORAL_TABLET | ORAL | 1 refills | Status: DC

## 2019-09-04 ENCOUNTER — Encounter: Payer: Self-pay | Admitting: Family Medicine

## 2019-09-04 DIAGNOSIS — Z0000 Encounter for general adult medical examination without abnormal findings: Secondary | ICD-10-CM

## 2019-09-07 NOTE — Telephone Encounter (Signed)
I did the referral to Dr. Rozetta Nunnery. No I do not know any physicians in that area

## 2019-09-18 ENCOUNTER — Other Ambulatory Visit: Payer: Self-pay | Admitting: Internal Medicine

## 2019-09-18 ENCOUNTER — Other Ambulatory Visit: Payer: Self-pay | Admitting: Family Medicine

## 2019-09-18 NOTE — Telephone Encounter (Signed)
Last filled 03/06/2019 Last OV 03/20/2019  Ok to fill?

## 2019-10-01 DIAGNOSIS — M546 Pain in thoracic spine: Secondary | ICD-10-CM | POA: Diagnosis not present

## 2019-10-01 DIAGNOSIS — M549 Dorsalgia, unspecified: Secondary | ICD-10-CM | POA: Diagnosis not present

## 2019-10-08 ENCOUNTER — Encounter: Payer: Self-pay | Admitting: Internal Medicine

## 2019-10-12 DIAGNOSIS — Z01411 Encounter for gynecological examination (general) (routine) with abnormal findings: Secondary | ICD-10-CM | POA: Diagnosis not present

## 2019-10-12 DIAGNOSIS — N94810 Vulvar vestibulitis: Secondary | ICD-10-CM | POA: Diagnosis not present

## 2019-10-12 MED ORDER — EMPAGLIFLOZIN 25 MG PO TABS: 25.0000 mg | ORAL_TABLET | Freq: Every day | ORAL | 3 refills | Status: AC

## 2019-10-12 NOTE — Telephone Encounter (Signed)
Jardiance sent. 

## 2019-10-13 DIAGNOSIS — Z01419 Encounter for gynecological examination (general) (routine) without abnormal findings: Secondary | ICD-10-CM | POA: Diagnosis not present

## 2019-10-28 DIAGNOSIS — N899 Noninflammatory disorder of vagina, unspecified: Secondary | ICD-10-CM | POA: Diagnosis not present

## 2019-10-28 DIAGNOSIS — E1140 Type 2 diabetes mellitus with diabetic neuropathy, unspecified: Secondary | ICD-10-CM | POA: Diagnosis not present

## 2019-10-28 DIAGNOSIS — F418 Other specified anxiety disorders: Secondary | ICD-10-CM | POA: Diagnosis not present

## 2019-10-28 DIAGNOSIS — M25549 Pain in joints of unspecified hand: Secondary | ICD-10-CM | POA: Diagnosis not present

## 2019-11-17 DIAGNOSIS — Z20822 Contact with and (suspected) exposure to covid-19: Secondary | ICD-10-CM | POA: Diagnosis not present

## 2019-11-20 DIAGNOSIS — I10 Essential (primary) hypertension: Secondary | ICD-10-CM | POA: Diagnosis not present

## 2019-11-20 DIAGNOSIS — D124 Benign neoplasm of descending colon: Secondary | ICD-10-CM | POA: Diagnosis not present

## 2019-11-20 DIAGNOSIS — Z1211 Encounter for screening for malignant neoplasm of colon: Secondary | ICD-10-CM | POA: Diagnosis not present

## 2019-12-16 DIAGNOSIS — M545 Low back pain: Secondary | ICD-10-CM | POA: Diagnosis not present

## 2019-12-16 DIAGNOSIS — M5136 Other intervertebral disc degeneration, lumbar region: Secondary | ICD-10-CM | POA: Diagnosis not present

## 2019-12-16 DIAGNOSIS — M47816 Spondylosis without myelopathy or radiculopathy, lumbar region: Secondary | ICD-10-CM | POA: Diagnosis not present

## 2019-12-25 ENCOUNTER — Other Ambulatory Visit: Payer: Self-pay

## 2019-12-25 ENCOUNTER — Encounter: Payer: Self-pay | Admitting: Internal Medicine

## 2019-12-25 ENCOUNTER — Ambulatory Visit: Payer: Self-pay | Admitting: Internal Medicine

## 2019-12-25 VITALS — BP 112/72 | HR 73 | Ht 61.0 in | Wt 161.2 lb

## 2019-12-25 DIAGNOSIS — E669 Obesity, unspecified: Secondary | ICD-10-CM | POA: Diagnosis not present

## 2019-12-25 DIAGNOSIS — N760 Acute vaginitis: Secondary | ICD-10-CM | POA: Diagnosis not present

## 2019-12-25 DIAGNOSIS — E1165 Type 2 diabetes mellitus with hyperglycemia: Secondary | ICD-10-CM

## 2019-12-25 DIAGNOSIS — Z6832 Body mass index (BMI) 32.0-32.9, adult: Secondary | ICD-10-CM

## 2019-12-25 DIAGNOSIS — E785 Hyperlipidemia, unspecified: Secondary | ICD-10-CM | POA: Diagnosis not present

## 2019-12-25 LAB — POCT GLYCOSYLATED HEMOGLOBIN (HGB A1C): Hemoglobin A1C: 6.1 % — AB (ref 4.0–5.6)

## 2019-12-25 MED ORDER — OZEMPIC (0.25 OR 0.5 MG/DOSE) 2 MG/1.5ML ~~LOC~~ SOPN: 0.5000 mg | PEN_INJECTOR | SUBCUTANEOUS | 3 refills | Status: AC

## 2019-12-25 NOTE — Progress Notes (Signed)
Patient ID: Monica Cunningham, female   DOB: 10-14-1960, 59 y.o.   MRN: [id number]   This visit occurred during the SARS-CoV-2 public health emergency.  Safety protocols were in place, including screening questions prior to the visit, additional usage of staff PPE, and extensive cleaning of exam room while observing appropriate contact time as indicated for disinfecting solutions.   HPI: Monica Cunningham is a 59 y.o.-year-old female, presenting for f/u for DM2, dx as GDM in 2003, then DM2 in 2005, non-insulin-dependent, uncontrolled, without long term complications. Last visit 6 months ago (virtual).  Since last visit, she moved to [place] to be closer to family.  Reviewed HbA1c levels: Lab Results  Component Value Date   HGBA1C 6.5 03/20/2019   HGBA1C 6.7 (A) 06/03/2018   HGBA1C 6.9 (H) 01/22/2018   Pt is on a regimen of: - Metformin 2000 mg with dinner >> cramping, diarrhea >> 1000 mg 2x a day - Invokana 100 mg daily before b'fast - Ozempic 0.5 mg weekly - started 06/2019 - still a little diarrhea We stopped glipizide along with starting Ozempic 06/2019.  Pt checks her sugars twice a day: - am:  122-196, 219 >> 96, 140-160, 164 (late dinner) >> 125-156 >> n/c - after coffee: 127-143, 160 - 2h after b'fast: 130-140s, 150s >> n/c  - before lunch:  126-170 >> 150s >> 140-150, 160 >> 127, 133 - 2h after lunch: 101-182 >> 90s-120 >> 90, 115-120 >> 154, 162 - before dinner:   90-130 >> 90-140 (snack) >> 150-170 >> 114-162, 182 - 2h after dinner: 90-110 or up to 170s, 200-pizza >> 140s >> 126-145 - bedtime:  see above >> 42 >> n/c >> 114, 120-130 >> n/c - nighttime: 51 x1, 60, 104-179 >> 91-203 >> same as above Lowest sugar was 42 at bedtime >> .Marland KitchenMarland Kitchen60s - 3x since last OV >> 46 >> 114; she has hypoglycemia awareness in the 70s. Highest sugar was 272 >>... 200 >> 210 >> 182.  Glucometer: [employer]  Pt's meals are: - Breakfast: chicken tenders and PB; 1 egg;  prev. muffins - Lunch: meat + veggies or salad - Dinner: protein, veggies, pizza; hamburger - Snacks: 3-4, diet soda, unsweet tea  She is walking for exercise.  -No CKD, last BUN/creatinine:  Lab Results  Component Value Date   BUN 19 03/20/2019   BUN 20 01/21/2018   CREATININE 0.88 03/20/2019   CREATININE 0.83 01/21/2018  On losartan. -+ HL. Last set of lipids: Lab Results  Component Value Date   CHOL 145 03/20/2019   HDL 44.50 03/20/2019   LDLCALC 72 03/20/2019   LDLDIRECT 120.9 04/15/2012   TRIG 141.0 03/20/2019   CHOLHDL 3 03/20/2019  On Zocor. - last eye exam was in 04/2018: No DR -No numbness and tingling in her feet.  She also has a history of HTN, PCOS, headaches- on nadolol.  She has a history of TAH.  Her dog has Cushing's ds.  No PMH of pancreatitis. No FH of MTC.  ROS: Constitutional: no weight gain/+ weight loss, no fatigue, no subjective hyperthermia, no subjective hypothermia Eyes: no blurry vision, no xerophthalmia ENT: no sore throat, no nodules palpated in neck, no dysphagia, no odynophagia, no hoarseness Cardiovascular: no CP/no SOB/no palpitations/no leg swelling Respiratory: no cough/no SOB/no wheezing Gastrointestinal: no N/no V/+ D/no C/no acid reflux Musculoskeletal: no muscle aches/no joint aches Skin: no rashes, no hair loss Neurological: no tremors/no numbness/no tingling/no dizziness  I reviewed pt's medications, allergies, PMH, social hx,  family hx, and changes were documented in the history of present illness. Otherwise, unchanged from my initial visit note.  Past Medical History:  Diagnosis Date  . ALLERGIC RHINITIS 09/15/2007  . DM (diabetes mellitus) ([place])    sees Dr. Cruzita Lederer   . Endometriosis   . Essential hypertension, benign 01/26/2007  . Fibroid   . HYPERLIPIDEMIA 09/15/2007  . Insomnia   . Obesity   . PCOS (polycystic ovarian syndrome)   . POLYCYSTIC OVARIAN DISEASE 09/15/2007  . Rosacea    Past Surgical History:   Procedure Laterality Date  . ABDOMINAL HYSTERECTOMY    . CESAREAN SECTION     x2  . CHOLECYSTECTOMY    . COLONOSCOPY  01/21/2009   per Dr. Carlean Purl, clear, repeat in 10 yrs   . PELVIC LAPAROSCOPY    . REDUCTION MAMMAPLASTY Bilateral   . REFRACTIVE SURGERY    . TUBAL LIGATION    . tummy tuck     Social History   Social History  . Marital status: Divorced    Spouse name: N/A  . Number of children: 3   Occupational History  . Accountant    Social History Main Topics  . Smoking status: Former Smoker    Packs/day: 0.25    Quit date: 2013  . Smokeless tobacco: Never Used  . Alcohol use     1-2 Glasses of wine per week       . Drug use: No  . Sexual activity: Yes    Birth control/ protection: Surgical   Current Outpatient Medications on File Prior to Visit  Medication Sig Dispense Refill  . aspirin EC 81 MG tablet Take 81 mg by mouth daily.    . Azelaic Acid (FINACEA) 15 % cream Apply 1 application topically 2 (two) times daily. 50 g 5  . benzonatate (TESSALON PERLES) 100 MG capsule Take 1 capsule (100 mg total) by mouth 3 (three) times daily as needed. 20 capsule 0  . canagliflozin (INVOKANA) 100 MG TABS tablet TAKE 1 TABLET BY MOUTH ONCE DAILY BEFORE BREAKFAST 90 tablet 1  . ciprofloxacin (CIPRO) 500 MG tablet Take 1 tablet (500 mg total) by mouth 2 (two) times daily. 28 tablet 0  . clonazePAM (KLONOPIN) 1 MG tablet TAKE 1 TABLET BY MOUTH ONCE DAILY AS NEEDED 90 tablet 1  . empagliflozin (JARDIANCE) 25 MG TABS tablet Take 1 tablet (25 mg total) by mouth daily before breakfast. 90 tablet 3  . fluconazole (DIFLUCAN) 150 MG tablet Take all 3 tabs 72 hours apart 3 tablet 5  . glipiZIDE (GLUCOTROL) 5 MG tablet TAKE 1 TO 2 TABLETS BY MOUTH TWICE DAILY BEFORE A MEAL 360 tablet 0  . glucose blood (CONTOUR NEXT TEST) test strip USE UP TO 8 TIMES A DAY 300 each 11  . losartan-hydrochlorothiazide (HYZAAR) 100-25 MG tablet TAKE ONE TABLET BY MOUTH once DAILY 90 tablet 4  . metFORMIN  (GLUCOPHAGE) 1000 MG tablet TAKE 2 TABLETS BY MOUTH ONCE DAILY WITH  SUPPER 180 tablet 0  . montelukast (SINGULAIR) 10 MG tablet Take 1 tablet (10 mg total) by mouth daily. 90 tablet 3  . Semaglutide,0.25 or 0.5MG /DOS, (OZEMPIC, 0.25 OR 0.5 MG/DOSE,) 2 MG/1.5ML SOPN Inject 0.5 mg into the skin once a week. 2 pen 5  . simvastatin (ZOCOR) 40 MG tablet Take 1 tablet (40 mg total) by mouth at bedtime. 90 tablet 3  . Sulfacetamide Sodium-Sulfur 10-5 % EMUL Apply small amounts once daily 227 g 4  . terconazole (TERAZOL 3) 0.8 %  vaginal cream Place 1 applicator vaginally at bedtime. 20 g 5   No current facility-administered medications on file prior to visit.   Allergies  Allergen Reactions  . Codeine     REACTION: nausea   Family History  Problem Relation Age of Onset  . Hypertension Mother   . Diabetes Mother   . Colon polyps Father   . Hypertension Father   . Heart disease Father   . Heart disease Maternal Grandmother   . Diabetes Sister        gestational  . Hypertension Sister   . Hypertension Brother   . Heart disease Maternal Grandfather   . Cancer Paternal Grandmother        Kidney cancer  . Kidney cancer Paternal Grandmother   . Breast cancer Neg Hx     PE: BP 112/72 (BP Location: Right Arm, Patient Position: Sitting, Cuff Size: Normal)   Pulse 73   Ht 5\' 1"  (1.549 m)   Wt 161 lb 3.2 oz (73.1 kg)   SpO2 99%   BMI 30.46 kg/m  Wt Readings from Last 3 Encounters:  12/25/19 161 lb 3.2 oz (73.1 kg)  03/20/19 172 lb 12.8 oz (78.4 kg)  06/03/18 178 lb (80.7 kg)   Constitutional: overweight, in NAD Eyes: PERRLA, EOMI, no exophthalmos ENT: moist mucous membranes, no thyromegaly, no cervical lymphadenopathy Cardiovascular: RRR, No MRG Respiratory: CTA B Gastrointestinal: abdomen soft, NT, ND, BS+ Musculoskeletal: no deformities, strength intact in all 4 Skin: moist, warm, no rashes Neurological: no tremor with outstretched hands, DTR normal in all 4  ASSESSMENT: 1.  DM2, non-insulin-dependent, uncontrolled, without long term complications, but with hyperglycemia  2. H/o yeast inf  3. HL  4.  Obesity class I  PLAN:  1. Patient with longstanding, previously uncontrolled type 2 diabetes, with much better control after adding Invokana and after she started to improve her diet and activity.  At last visit sugars were still above target, though, and they were especially high after her Covid 19 vaccine.  They appeared at goal after meals but they were higher than goal between meals.  We switched from sulfonylurea to a weekly GLP-1 receptor agonist.  At that time, HbA1c was still at goal, at 6.5%. -At this visit, per review of her meter downloads, her sugars improved since last visit.  They can still be above goal, but she is not checking her sugars on an empty stomach, but either after clamping the morning or afternoon other meals.  We discussed about trying to check on an empty stomach and also document in the meter whether sugars were checked before or after a meal. -She still has some diarrhea after Ozempic but this is not too bothersome and she would like to continue with Ozempic.  This was refilled today. - I suggested to:  Patient Instructions  Please continue: - Metformin 1000 mg 2x a day - Jardiance 25 mg daily before b'fast - Ozempic 0.5 mg weekly  Please return in 3-4 months with your sugar log.   - we checked her HbA1c: 6.1% (improved) - advised to check sugars at different times of the day - 1x a day, rotating check times - advised for yearly eye exams >> she is not UTD - We discussed about finding an endocrinologist in the area where she lives and she is trying to do so, but for now we will continue to come and see me. - return to clinic in 3-4 months     2.  H/o  yeast vaginitis -On terconazole as needed -No exacerbations since last visit  3. HL -Reviewed latest lipid panel from 03/2019: LDL at goal, the rest of the fractions also at  goal: Lab Results  Component Value Date   CHOL 145 03/20/2019   HDL 44.50 03/20/2019   LDLCALC 72 03/20/2019   LDLDIRECT 120.9 04/15/2012   TRIG 141.0 03/20/2019   CHOLHDL 3 03/20/2019  -Continue Zocor without side effects  4.  Obesity class I -continue SGLT 2 inhibitor and GLP-1 receptor agonist which should also help with weight loss -She lost 11 pounds since last visit!  Philemon Kingdom, MD PhD [facility]

## 2019-12-25 NOTE — Patient Instructions (Addendum)
Please continue: - Metformin 1000 mg 2x a day - Jardiance 25 mg daily before b'fast - Ozempic 0.5 mg weekly  Please return in 3-4 months with your sugar log.

## 2020-04-15 ENCOUNTER — Ambulatory Visit: Payer: Self-pay | Admitting: Internal Medicine

## 2020-05-03 DIAGNOSIS — Z0000 Encounter for general adult medical examination without abnormal findings: Secondary | ICD-10-CM | POA: Diagnosis not present

## 2020-05-18 ENCOUNTER — Telehealth: Payer: Self-pay | Admitting: Internal Medicine

## 2020-05-18 NOTE — Telephone Encounter (Signed)
Please advise 

## 2020-05-18 NOTE — Telephone Encounter (Signed)
Patient is Outbound Referral to Endocrinology in [place], [place] - patient is requesting a urgent referral because her original referral from her PCP was received and is now lost.  If she can not get one then she wont be able to see a doctor there until 01/2021   Receiving provider is Dr Derrill Kay in [place], [place] - fax [phone]  Call back for patient is [phone]

## 2020-05-19 ENCOUNTER — Other Ambulatory Visit: Payer: Self-pay | Admitting: Internal Medicine

## 2020-05-19 DIAGNOSIS — E1165 Type 2 diabetes mellitus with hyperglycemia: Secondary | ICD-10-CM

## 2020-05-19 DIAGNOSIS — E669 Obesity, unspecified: Secondary | ICD-10-CM

## 2020-05-19 DIAGNOSIS — E785 Hyperlipidemia, unspecified: Secondary | ICD-10-CM

## 2020-05-19 NOTE — Telephone Encounter (Signed)
OK, I placed a referral - urgent. C

## 2020-06-03 ENCOUNTER — Ambulatory Visit: Payer: Self-pay | Admitting: Internal Medicine

## 2020-06-22 DIAGNOSIS — E119 Type 2 diabetes mellitus without complications: Secondary | ICD-10-CM | POA: Diagnosis not present

## 2020-07-14 ENCOUNTER — Encounter: Payer: Self-pay | Admitting: Internal Medicine

## 2020-07-18 ENCOUNTER — Ambulatory Visit: Payer: Self-pay | Admitting: Internal Medicine

## 2020-08-09 DIAGNOSIS — Z1231 Encounter for screening mammogram for malignant neoplasm of breast: Secondary | ICD-10-CM | POA: Diagnosis not present

## 2020-08-19 DIAGNOSIS — Z7984 Long term (current) use of oral hypoglycemic drugs: Secondary | ICD-10-CM | POA: Diagnosis not present

## 2020-08-19 DIAGNOSIS — H25813 Combined forms of age-related cataract, bilateral: Secondary | ICD-10-CM | POA: Diagnosis not present

## 2020-08-19 DIAGNOSIS — E119 Type 2 diabetes mellitus without complications: Secondary | ICD-10-CM | POA: Diagnosis not present

## 2020-08-25 DIAGNOSIS — L905 Scar conditions and fibrosis of skin: Secondary | ICD-10-CM | POA: Diagnosis not present

## 2020-08-25 DIAGNOSIS — N6001 Solitary cyst of right breast: Secondary | ICD-10-CM | POA: Diagnosis not present

## 2020-08-25 DIAGNOSIS — R928 Other abnormal and inconclusive findings on diagnostic imaging of breast: Secondary | ICD-10-CM | POA: Diagnosis not present

## 2020-08-25 DIAGNOSIS — N6310 Unspecified lump in the right breast, unspecified quadrant: Secondary | ICD-10-CM | POA: Diagnosis not present

## 2020-08-25 DIAGNOSIS — Z8052 Family history of malignant neoplasm of bladder: Secondary | ICD-10-CM | POA: Diagnosis not present

## 2020-11-02 DIAGNOSIS — K469 Unspecified abdominal hernia without obstruction or gangrene: Secondary | ICD-10-CM | POA: Diagnosis not present

## 2020-11-02 DIAGNOSIS — R109 Unspecified abdominal pain: Secondary | ICD-10-CM | POA: Diagnosis not present

## 2020-11-10 DIAGNOSIS — E119 Type 2 diabetes mellitus without complications: Secondary | ICD-10-CM | POA: Diagnosis not present

## 2020-11-14 DIAGNOSIS — E119 Type 2 diabetes mellitus without complications: Secondary | ICD-10-CM | POA: Diagnosis not present

## 2020-11-14 DIAGNOSIS — F419 Anxiety disorder, unspecified: Secondary | ICD-10-CM | POA: Diagnosis not present

## 2020-11-14 DIAGNOSIS — K432 Incisional hernia without obstruction or gangrene: Secondary | ICD-10-CM | POA: Diagnosis not present

## 2020-11-16 DIAGNOSIS — Z79899 Other long term (current) drug therapy: Secondary | ICD-10-CM | POA: Diagnosis not present

## 2020-11-16 DIAGNOSIS — Z833 Family history of diabetes mellitus: Secondary | ICD-10-CM | POA: Diagnosis not present

## 2020-11-16 DIAGNOSIS — Z9049 Acquired absence of other specified parts of digestive tract: Secondary | ICD-10-CM | POA: Diagnosis not present

## 2020-11-16 DIAGNOSIS — Z885 Allergy status to narcotic agent status: Secondary | ICD-10-CM | POA: Diagnosis not present

## 2020-11-16 DIAGNOSIS — Z7984 Long term (current) use of oral hypoglycemic drugs: Secondary | ICD-10-CM | POA: Diagnosis not present

## 2020-11-16 DIAGNOSIS — K432 Incisional hernia without obstruction or gangrene: Secondary | ICD-10-CM | POA: Diagnosis not present

## 2020-11-16 DIAGNOSIS — Z90710 Acquired absence of both cervix and uterus: Secondary | ICD-10-CM | POA: Diagnosis not present

## 2020-11-16 DIAGNOSIS — Z8249 Family history of ischemic heart disease and other diseases of the circulatory system: Secondary | ICD-10-CM | POA: Diagnosis not present

## 2020-11-16 DIAGNOSIS — Z87891 Personal history of nicotine dependence: Secondary | ICD-10-CM | POA: Diagnosis not present

## 2020-11-16 DIAGNOSIS — Z841 Family history of disorders of kidney and ureter: Secondary | ICD-10-CM | POA: Diagnosis not present

## 2020-11-16 DIAGNOSIS — F418 Other specified anxiety disorders: Secondary | ICD-10-CM | POA: Diagnosis not present

## 2020-11-16 DIAGNOSIS — Z791 Long term (current) use of non-steroidal anti-inflammatories (NSAID): Secondary | ICD-10-CM | POA: Diagnosis not present

## 2020-11-16 DIAGNOSIS — Z7982 Long term (current) use of aspirin: Secondary | ICD-10-CM | POA: Diagnosis not present

## 2020-11-16 DIAGNOSIS — E119 Type 2 diabetes mellitus without complications: Secondary | ICD-10-CM | POA: Diagnosis not present

## 2020-11-16 DIAGNOSIS — Z8052 Family history of malignant neoplasm of bladder: Secondary | ICD-10-CM | POA: Diagnosis not present

## 2020-11-18 DIAGNOSIS — F418 Other specified anxiety disorders: Secondary | ICD-10-CM | POA: Diagnosis not present

## 2020-11-18 DIAGNOSIS — Z8052 Family history of malignant neoplasm of bladder: Secondary | ICD-10-CM | POA: Diagnosis not present

## 2020-11-18 DIAGNOSIS — Z833 Family history of diabetes mellitus: Secondary | ICD-10-CM | POA: Diagnosis not present

## 2020-11-18 DIAGNOSIS — Z7984 Long term (current) use of oral hypoglycemic drugs: Secondary | ICD-10-CM | POA: Diagnosis not present

## 2020-11-18 DIAGNOSIS — Z87891 Personal history of nicotine dependence: Secondary | ICD-10-CM | POA: Diagnosis not present

## 2020-11-18 DIAGNOSIS — Z7982 Long term (current) use of aspirin: Secondary | ICD-10-CM | POA: Diagnosis not present

## 2020-11-18 DIAGNOSIS — Z8249 Family history of ischemic heart disease and other diseases of the circulatory system: Secondary | ICD-10-CM | POA: Diagnosis not present

## 2020-11-18 DIAGNOSIS — Z79899 Other long term (current) drug therapy: Secondary | ICD-10-CM | POA: Diagnosis not present

## 2020-11-18 DIAGNOSIS — E119 Type 2 diabetes mellitus without complications: Secondary | ICD-10-CM | POA: Diagnosis not present

## 2020-11-18 DIAGNOSIS — K432 Incisional hernia without obstruction or gangrene: Secondary | ICD-10-CM | POA: Diagnosis not present

## 2020-11-18 DIAGNOSIS — Z791 Long term (current) use of non-steroidal anti-inflammatories (NSAID): Secondary | ICD-10-CM | POA: Diagnosis not present

## 2020-11-18 DIAGNOSIS — Z9049 Acquired absence of other specified parts of digestive tract: Secondary | ICD-10-CM | POA: Diagnosis not present

## 2020-11-18 DIAGNOSIS — G8918 Other acute postprocedural pain: Secondary | ICD-10-CM | POA: Diagnosis not present

## 2020-11-18 DIAGNOSIS — Z90710 Acquired absence of both cervix and uterus: Secondary | ICD-10-CM | POA: Diagnosis not present

## 2020-11-18 DIAGNOSIS — Z841 Family history of disorders of kidney and ureter: Secondary | ICD-10-CM | POA: Diagnosis not present

## 2020-11-18 DIAGNOSIS — Z885 Allergy status to narcotic agent status: Secondary | ICD-10-CM | POA: Diagnosis not present

## 2021-01-05 DIAGNOSIS — D1801 Hemangioma of skin and subcutaneous tissue: Secondary | ICD-10-CM | POA: Diagnosis not present

## 2021-01-05 DIAGNOSIS — L821 Other seborrheic keratosis: Secondary | ICD-10-CM | POA: Diagnosis not present

## 2021-01-05 DIAGNOSIS — L578 Other skin changes due to chronic exposure to nonionizing radiation: Secondary | ICD-10-CM | POA: Diagnosis not present

## 2021-01-05 DIAGNOSIS — L820 Inflamed seborrheic keratosis: Secondary | ICD-10-CM | POA: Diagnosis not present

## 2021-01-05 DIAGNOSIS — L280 Lichen simplex chronicus: Secondary | ICD-10-CM | POA: Diagnosis not present

## 2021-01-05 DIAGNOSIS — D229 Melanocytic nevi, unspecified: Secondary | ICD-10-CM | POA: Diagnosis not present

## 2021-01-30 DIAGNOSIS — Z01419 Encounter for gynecological examination (general) (routine) without abnormal findings: Secondary | ICD-10-CM | POA: Diagnosis not present

## 2021-01-31 DIAGNOSIS — Z124 Encounter for screening for malignant neoplasm of cervix: Secondary | ICD-10-CM | POA: Diagnosis not present

## 2021-02-08 DIAGNOSIS — M1811 Unilateral primary osteoarthritis of first carpometacarpal joint, right hand: Secondary | ICD-10-CM | POA: Diagnosis not present

## 2021-02-08 DIAGNOSIS — M67431 Ganglion, right wrist: Secondary | ICD-10-CM | POA: Diagnosis not present

## 2021-02-08 DIAGNOSIS — M79641 Pain in right hand: Secondary | ICD-10-CM | POA: Diagnosis not present

## 2021-02-08 DIAGNOSIS — M67441 Ganglion, right hand: Secondary | ICD-10-CM | POA: Diagnosis not present

## 2021-03-06 DIAGNOSIS — M65331 Trigger finger, right middle finger: Secondary | ICD-10-CM | POA: Diagnosis not present

## 2021-03-06 DIAGNOSIS — M67441 Ganglion, right hand: Secondary | ICD-10-CM | POA: Diagnosis not present

## 2021-03-06 DIAGNOSIS — M71341 Other bursal cyst, right hand: Secondary | ICD-10-CM | POA: Diagnosis not present

## 2021-05-02 DIAGNOSIS — F419 Anxiety disorder, unspecified: Secondary | ICD-10-CM | POA: Diagnosis not present

## 2021-05-02 DIAGNOSIS — Z0000 Encounter for general adult medical examination without abnormal findings: Secondary | ICD-10-CM | POA: Diagnosis not present

## 2021-05-02 DIAGNOSIS — I10 Essential (primary) hypertension: Secondary | ICD-10-CM | POA: Diagnosis not present

## 2021-05-02 DIAGNOSIS — E119 Type 2 diabetes mellitus without complications: Secondary | ICD-10-CM | POA: Diagnosis not present

## 2021-05-09 DIAGNOSIS — Z0000 Encounter for general adult medical examination without abnormal findings: Secondary | ICD-10-CM | POA: Diagnosis not present

## 2021-05-15 DIAGNOSIS — E119 Type 2 diabetes mellitus without complications: Secondary | ICD-10-CM | POA: Diagnosis not present

## 2021-06-19 DIAGNOSIS — N390 Urinary tract infection, site not specified: Secondary | ICD-10-CM | POA: Diagnosis not present

## 2021-06-19 DIAGNOSIS — R109 Unspecified abdominal pain: Secondary | ICD-10-CM | POA: Diagnosis not present

## 2021-06-19 DIAGNOSIS — R399 Unspecified symptoms and signs involving the genitourinary system: Secondary | ICD-10-CM | POA: Diagnosis not present

## 2021-06-19 DIAGNOSIS — Z7982 Long term (current) use of aspirin: Secondary | ICD-10-CM | POA: Diagnosis not present

## 2021-06-19 DIAGNOSIS — Z79899 Other long term (current) drug therapy: Secondary | ICD-10-CM | POA: Diagnosis not present

## 2021-07-03 DIAGNOSIS — E1159 Type 2 diabetes mellitus with other circulatory complications: Secondary | ICD-10-CM | POA: Diagnosis not present

## 2021-07-03 DIAGNOSIS — E119 Type 2 diabetes mellitus without complications: Secondary | ICD-10-CM | POA: Diagnosis not present

## 2021-07-03 DIAGNOSIS — E1169 Type 2 diabetes mellitus with other specified complication: Secondary | ICD-10-CM | POA: Diagnosis not present

## 2021-07-03 DIAGNOSIS — R002 Palpitations: Secondary | ICD-10-CM | POA: Diagnosis not present

## 2021-07-04 DIAGNOSIS — K760 Fatty (change of) liver, not elsewhere classified: Secondary | ICD-10-CM | POA: Diagnosis not present

## 2021-07-04 DIAGNOSIS — R109 Unspecified abdominal pain: Secondary | ICD-10-CM | POA: Diagnosis not present

## 2021-07-06 DIAGNOSIS — R109 Unspecified abdominal pain: Secondary | ICD-10-CM | POA: Diagnosis not present

## 2021-07-06 DIAGNOSIS — F418 Other specified anxiety disorders: Secondary | ICD-10-CM | POA: Diagnosis not present

## 2021-07-24 DIAGNOSIS — R1011 Right upper quadrant pain: Secondary | ICD-10-CM | POA: Diagnosis not present

## 2021-07-25 DIAGNOSIS — F418 Other specified anxiety disorders: Secondary | ICD-10-CM | POA: Diagnosis not present

## 2021-07-25 DIAGNOSIS — E785 Hyperlipidemia, unspecified: Secondary | ICD-10-CM | POA: Diagnosis not present

## 2021-07-25 DIAGNOSIS — R1011 Right upper quadrant pain: Secondary | ICD-10-CM | POA: Diagnosis not present

## 2021-07-25 DIAGNOSIS — R002 Palpitations: Secondary | ICD-10-CM | POA: Diagnosis not present

## 2021-07-25 DIAGNOSIS — I10 Essential (primary) hypertension: Secondary | ICD-10-CM | POA: Diagnosis not present

## 2021-07-25 DIAGNOSIS — E119 Type 2 diabetes mellitus without complications: Secondary | ICD-10-CM | POA: Diagnosis not present

## 2021-07-25 DIAGNOSIS — R1013 Epigastric pain: Secondary | ICD-10-CM | POA: Diagnosis not present

## 2021-07-27 DIAGNOSIS — R002 Palpitations: Secondary | ICD-10-CM | POA: Diagnosis not present

## 2021-07-27 DIAGNOSIS — I471 Supraventricular tachycardia: Secondary | ICD-10-CM | POA: Diagnosis not present

## 2021-07-27 DIAGNOSIS — E1169 Type 2 diabetes mellitus with other specified complication: Secondary | ICD-10-CM | POA: Diagnosis not present

## 2021-07-27 DIAGNOSIS — E119 Type 2 diabetes mellitus without complications: Secondary | ICD-10-CM | POA: Diagnosis not present

## 2021-07-31 DIAGNOSIS — F418 Other specified anxiety disorders: Secondary | ICD-10-CM | POA: Diagnosis not present

## 2021-07-31 DIAGNOSIS — Z87891 Personal history of nicotine dependence: Secondary | ICD-10-CM | POA: Diagnosis not present

## 2021-07-31 DIAGNOSIS — R109 Unspecified abdominal pain: Secondary | ICD-10-CM | POA: Diagnosis not present

## 2021-08-02 DIAGNOSIS — I2510 Atherosclerotic heart disease of native coronary artery without angina pectoris: Secondary | ICD-10-CM | POA: Diagnosis not present

## 2021-08-02 DIAGNOSIS — R109 Unspecified abdominal pain: Secondary | ICD-10-CM | POA: Diagnosis not present

## 2021-08-10 DIAGNOSIS — I10 Essential (primary) hypertension: Secondary | ICD-10-CM | POA: Diagnosis not present

## 2021-08-10 DIAGNOSIS — E785 Hyperlipidemia, unspecified: Secondary | ICD-10-CM | POA: Diagnosis not present

## 2021-08-10 DIAGNOSIS — R109 Unspecified abdominal pain: Secondary | ICD-10-CM | POA: Diagnosis not present

## 2021-08-10 DIAGNOSIS — E119 Type 2 diabetes mellitus without complications: Secondary | ICD-10-CM | POA: Diagnosis not present

## 2021-09-11 DIAGNOSIS — Z1231 Encounter for screening mammogram for malignant neoplasm of breast: Secondary | ICD-10-CM | POA: Diagnosis not present

## 2021-10-06 DIAGNOSIS — E785 Hyperlipidemia, unspecified: Secondary | ICD-10-CM | POA: Diagnosis not present

## 2021-10-06 DIAGNOSIS — E7800 Pure hypercholesterolemia, unspecified: Secondary | ICD-10-CM | POA: Diagnosis not present

## 2021-10-06 DIAGNOSIS — I10 Essential (primary) hypertension: Secondary | ICD-10-CM | POA: Diagnosis not present

## 2021-10-09 DIAGNOSIS — R1011 Right upper quadrant pain: Secondary | ICD-10-CM | POA: Diagnosis not present

## 2021-10-09 DIAGNOSIS — R17 Unspecified jaundice: Secondary | ICD-10-CM | POA: Diagnosis not present

## 2021-10-09 DIAGNOSIS — K760 Fatty (change of) liver, not elsewhere classified: Secondary | ICD-10-CM | POA: Diagnosis not present

## 2021-10-09 DIAGNOSIS — R194 Change in bowel habit: Secondary | ICD-10-CM | POA: Diagnosis not present

## 2021-10-09 DIAGNOSIS — E1100 Type 2 diabetes mellitus with hyperosmolarity without nonketotic hyperglycemic-hyperosmolar coma (NKHHC): Secondary | ICD-10-CM | POA: Diagnosis not present

## 2021-10-09 DIAGNOSIS — Z8614 Personal history of Methicillin resistant Staphylococcus aureus infection: Secondary | ICD-10-CM | POA: Diagnosis not present

## 2021-10-10 DIAGNOSIS — I10 Essential (primary) hypertension: Secondary | ICD-10-CM | POA: Diagnosis not present

## 2021-10-10 DIAGNOSIS — E785 Hyperlipidemia, unspecified: Secondary | ICD-10-CM | POA: Diagnosis not present

## 2021-10-10 DIAGNOSIS — Z8614 Personal history of Methicillin resistant Staphylococcus aureus infection: Secondary | ICD-10-CM | POA: Diagnosis not present

## 2021-10-10 DIAGNOSIS — E1169 Type 2 diabetes mellitus with other specified complication: Secondary | ICD-10-CM | POA: Diagnosis not present

## 2021-10-10 DIAGNOSIS — E1159 Type 2 diabetes mellitus with other circulatory complications: Secondary | ICD-10-CM | POA: Diagnosis not present

## 2021-10-11 DIAGNOSIS — Z8614 Personal history of Methicillin resistant Staphylococcus aureus infection: Secondary | ICD-10-CM | POA: Diagnosis not present

## 2021-10-11 DIAGNOSIS — R1011 Right upper quadrant pain: Secondary | ICD-10-CM | POA: Diagnosis not present

## 2021-10-11 DIAGNOSIS — R194 Change in bowel habit: Secondary | ICD-10-CM | POA: Diagnosis not present

## 2021-10-11 DIAGNOSIS — R17 Unspecified jaundice: Secondary | ICD-10-CM | POA: Diagnosis not present

## 2021-10-11 DIAGNOSIS — K760 Fatty (change of) liver, not elsewhere classified: Secondary | ICD-10-CM | POA: Diagnosis not present

## 2021-11-03 DIAGNOSIS — R194 Change in bowel habit: Secondary | ICD-10-CM | POA: Diagnosis not present

## 2021-11-03 DIAGNOSIS — R1011 Right upper quadrant pain: Secondary | ICD-10-CM | POA: Diagnosis not present

## 2021-11-03 DIAGNOSIS — K760 Fatty (change of) liver, not elsewhere classified: Secondary | ICD-10-CM | POA: Diagnosis not present

## 2021-11-03 DIAGNOSIS — R17 Unspecified jaundice: Secondary | ICD-10-CM | POA: Diagnosis not present

## 2021-11-03 DIAGNOSIS — Z86010 Personal history of colonic polyps: Secondary | ICD-10-CM | POA: Diagnosis not present

## 2021-11-03 DIAGNOSIS — I10 Essential (primary) hypertension: Secondary | ICD-10-CM | POA: Diagnosis not present

## 2021-11-09 DIAGNOSIS — T182XXA Foreign body in stomach, initial encounter: Secondary | ICD-10-CM | POA: Diagnosis not present

## 2021-11-09 DIAGNOSIS — Z6828 Body mass index (BMI) 28.0-28.9, adult: Secondary | ICD-10-CM | POA: Diagnosis not present

## 2021-11-09 DIAGNOSIS — K760 Fatty (change of) liver, not elsewhere classified: Secondary | ICD-10-CM | POA: Diagnosis not present

## 2021-11-09 DIAGNOSIS — R1011 Right upper quadrant pain: Secondary | ICD-10-CM | POA: Diagnosis not present

## 2021-11-17 IMAGING — MG DIGITAL SCREENING BILAT W/ TOMO W/ CAD
8 series · 8 of 24 positions shown · non-contrast
Comparison: Previous exam(s).

CLINICAL DATA: Screening.

EXAM:
DIGITAL SCREENING BILATERAL MAMMOGRAM WITH TOMO AND CAD

[R MLO synth-2D]
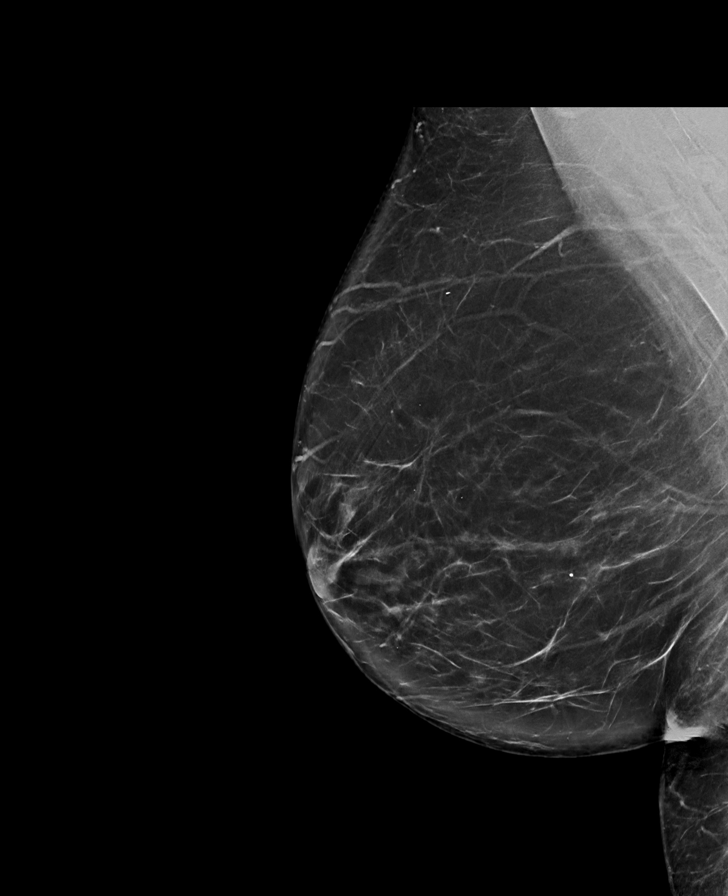

[L MLO synth-2D]
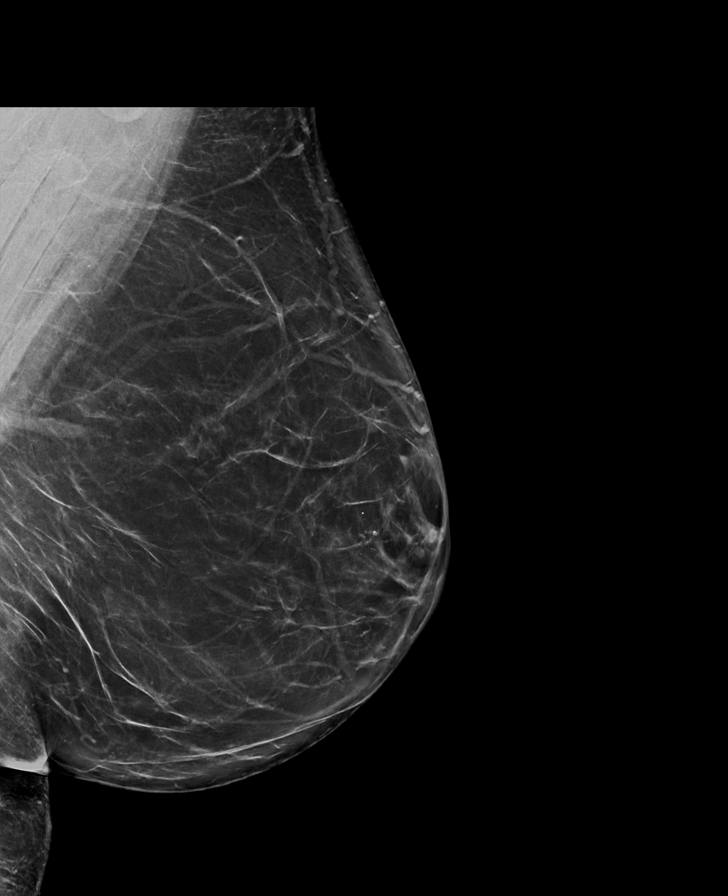

[R CC synth-2D]
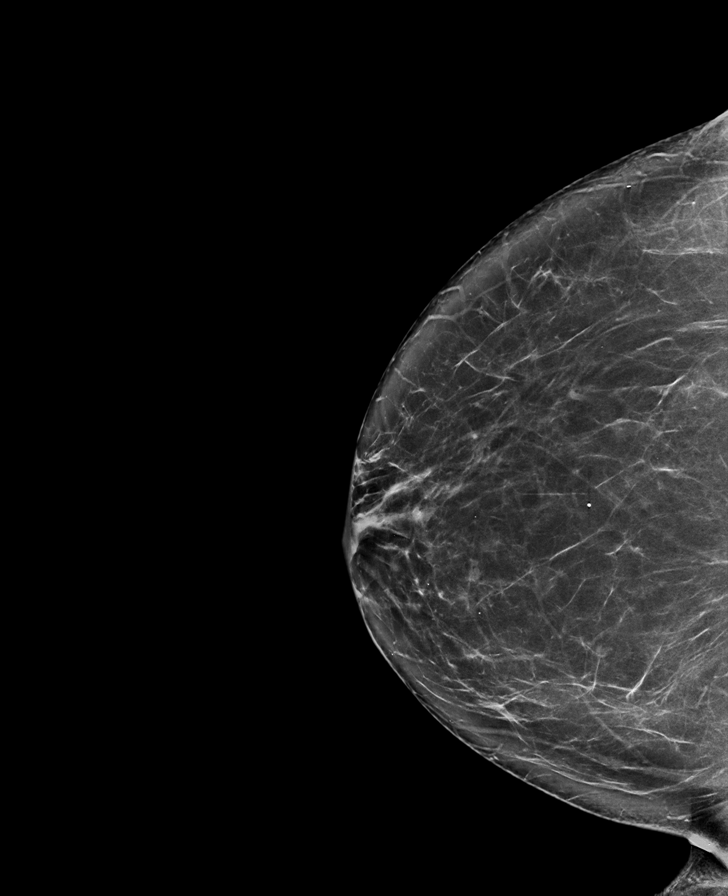

[L CC synth-2D]
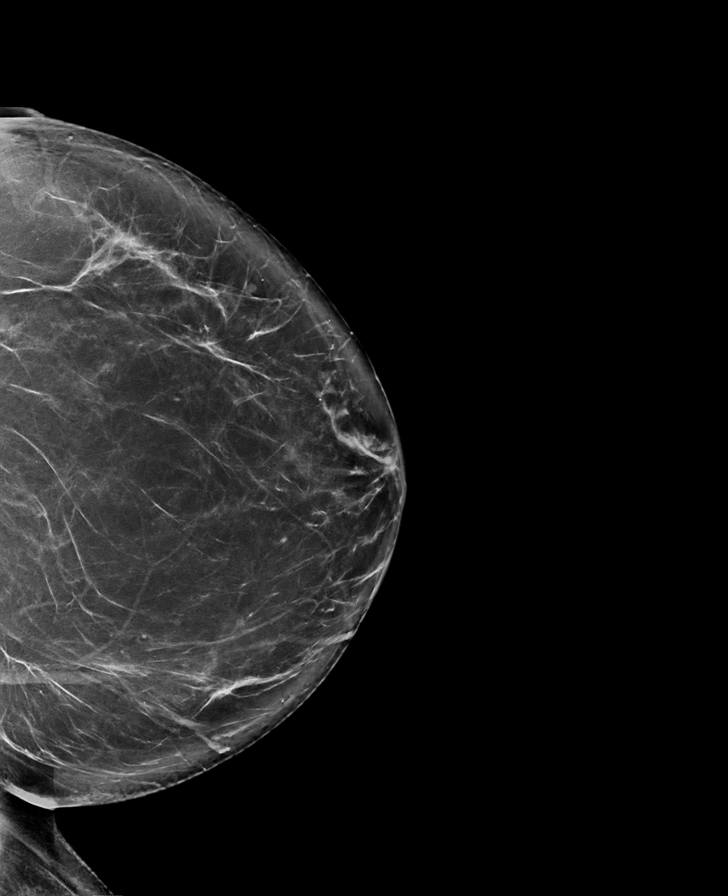

[L CC tomo · tomo slice 43/86.0]
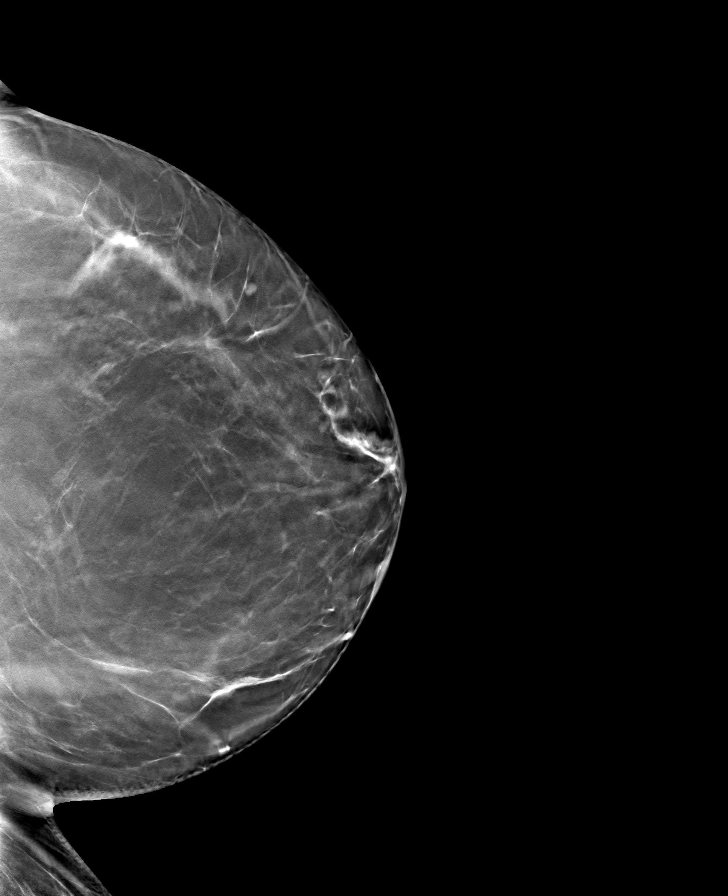

[R CC tomo · tomo slice 43/84.0]
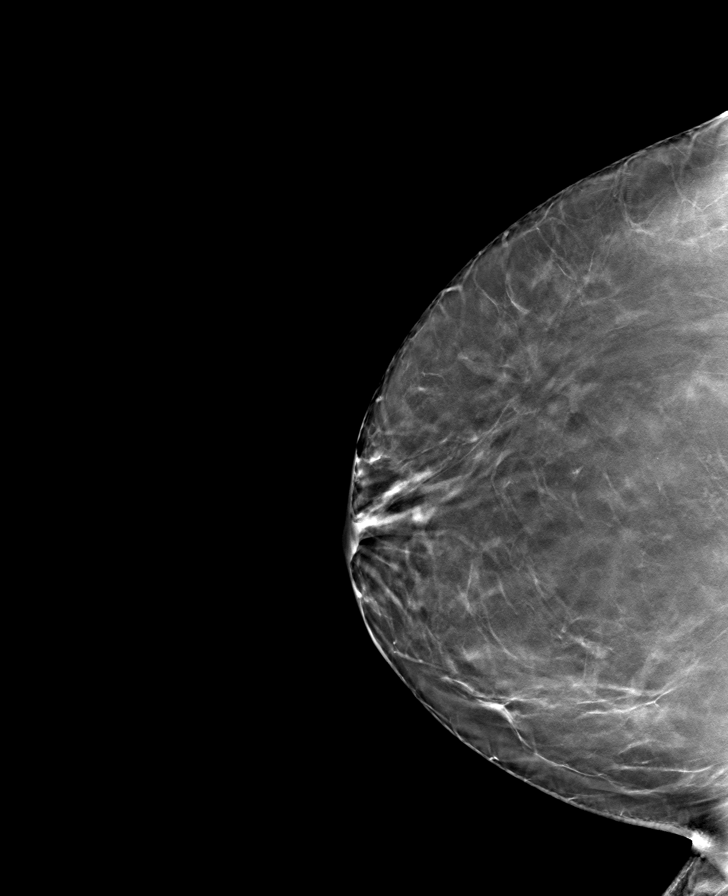

[L MLO tomo · tomo slice 47/93.0]
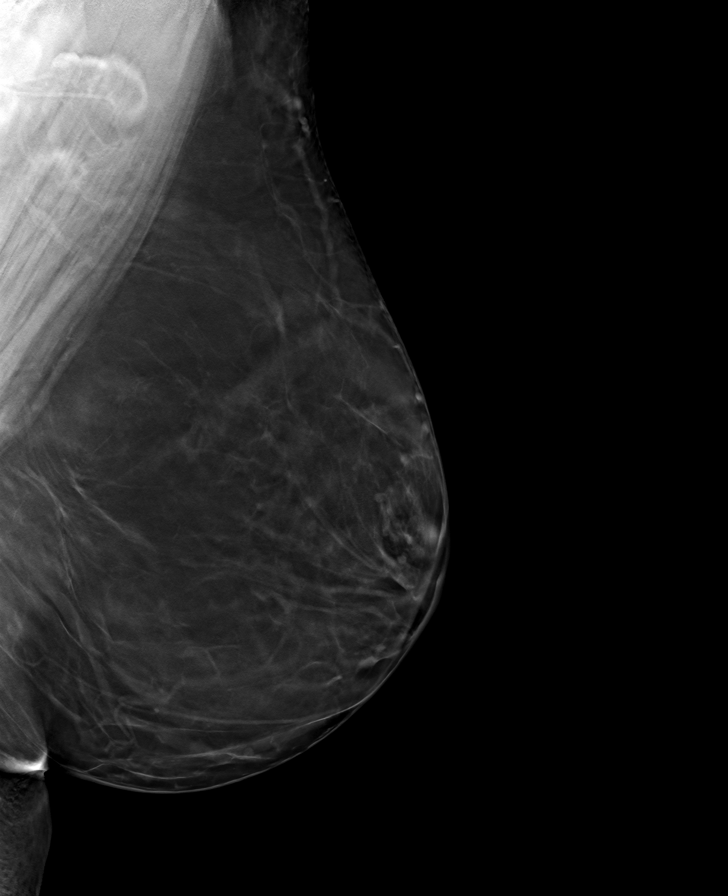

[R MLO tomo · tomo slice 48/95.0]
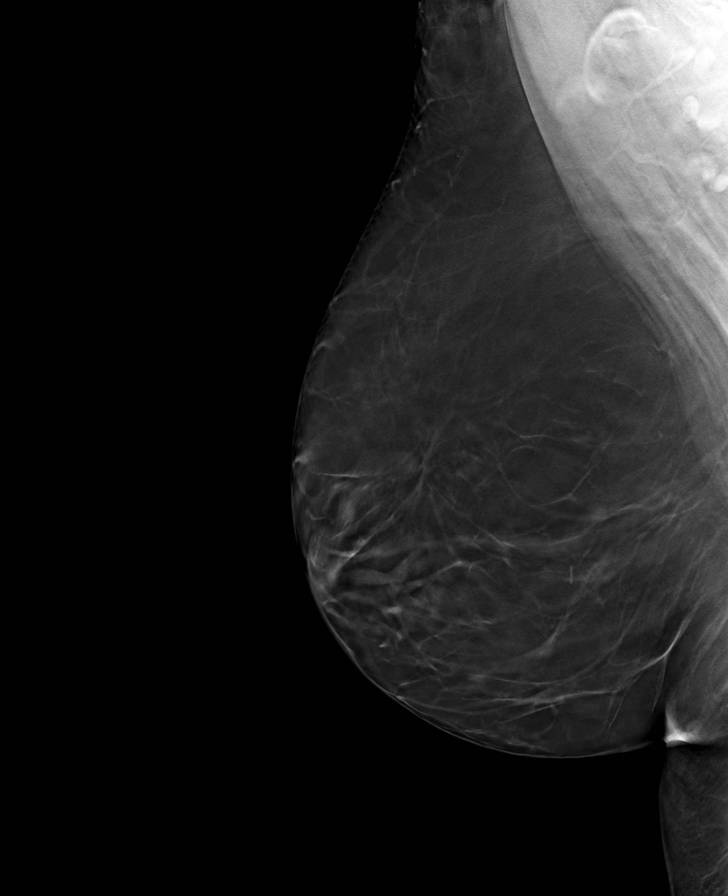

[8 of 24 positions shown; findings below may reference images not displayed]

ACR Breast Density Category b: There are scattered areas of
fibroglandular density.
FINDINGS: There are no findings suspicious for malignancy. Images were
processed with CAD.
IMPRESSION: No mammographic evidence of malignancy. A result letter of this
screening mammogram will be mailed directly to the patient.

RECOMMENDATION:
Screening mammogram in one year. (Code:[id number])

BI-RADS CATEGORY  1: Negative.

## 2021-11-20 DIAGNOSIS — E119 Type 2 diabetes mellitus without complications: Secondary | ICD-10-CM | POA: Diagnosis not present

## 2021-11-29 DIAGNOSIS — Z91018 Allergy to other foods: Secondary | ICD-10-CM | POA: Diagnosis not present

## 2021-11-29 DIAGNOSIS — K760 Fatty (change of) liver, not elsewhere classified: Secondary | ICD-10-CM | POA: Diagnosis not present

## 2021-11-29 DIAGNOSIS — K3184 Gastroparesis: Secondary | ICD-10-CM | POA: Diagnosis not present

## 2021-12-24 DIAGNOSIS — R062 Wheezing: Secondary | ICD-10-CM | POA: Diagnosis not present

## 2021-12-24 DIAGNOSIS — J019 Acute sinusitis, unspecified: Secondary | ICD-10-CM | POA: Diagnosis not present

## 2022-01-01 DIAGNOSIS — R61 Generalized hyperhidrosis: Secondary | ICD-10-CM | POA: Diagnosis not present

## 2022-01-01 DIAGNOSIS — D45 Polycythemia vera: Secondary | ICD-10-CM | POA: Diagnosis not present

## 2022-01-01 DIAGNOSIS — D751 Secondary polycythemia: Secondary | ICD-10-CM | POA: Diagnosis not present

## 2022-01-02 DIAGNOSIS — J189 Pneumonia, unspecified organism: Secondary | ICD-10-CM | POA: Diagnosis not present

## 2022-01-02 DIAGNOSIS — R059 Cough, unspecified: Secondary | ICD-10-CM | POA: Diagnosis not present

## 2022-01-05 DIAGNOSIS — D1801 Hemangioma of skin and subcutaneous tissue: Secondary | ICD-10-CM | POA: Diagnosis not present

## 2022-01-05 DIAGNOSIS — L578 Other skin changes due to chronic exposure to nonionizing radiation: Secondary | ICD-10-CM | POA: Diagnosis not present

## 2022-01-05 DIAGNOSIS — L821 Other seborrheic keratosis: Secondary | ICD-10-CM | POA: Diagnosis not present

## 2022-01-05 DIAGNOSIS — D229 Melanocytic nevi, unspecified: Secondary | ICD-10-CM | POA: Diagnosis not present

## 2022-01-12 DIAGNOSIS — E785 Hyperlipidemia, unspecified: Secondary | ICD-10-CM | POA: Diagnosis not present

## 2022-01-12 DIAGNOSIS — D649 Anemia, unspecified: Secondary | ICD-10-CM | POA: Diagnosis not present

## 2022-01-12 DIAGNOSIS — J189 Pneumonia, unspecified organism: Secondary | ICD-10-CM | POA: Diagnosis not present

## 2022-01-19 DIAGNOSIS — E119 Type 2 diabetes mellitus without complications: Secondary | ICD-10-CM | POA: Diagnosis not present

## 2022-01-19 DIAGNOSIS — I10 Essential (primary) hypertension: Secondary | ICD-10-CM | POA: Diagnosis not present

## 2022-01-19 DIAGNOSIS — E785 Hyperlipidemia, unspecified: Secondary | ICD-10-CM | POA: Diagnosis not present

## 2022-01-19 DIAGNOSIS — Z7984 Long term (current) use of oral hypoglycemic drugs: Secondary | ICD-10-CM | POA: Diagnosis not present

## 2022-01-24 DIAGNOSIS — D751 Secondary polycythemia: Secondary | ICD-10-CM | POA: Diagnosis not present
